# Patient Record
Sex: Female | Born: 1975
Health system: Southern US, Community
[De-identification: ages and names within clinical notes are randomized; demographics above are authoritative.]

## PROBLEM LIST (undated history)

## (undated) HISTORY — PX: ABDOMINAL HYSTERECTOMY: SHX81

## (undated) HISTORY — PX: DIAGNOSTIC LAPAROSCOPY: SUR761

---

## 1998-03-29 ENCOUNTER — Inpatient Hospital Stay (HOSPITAL_COMMUNITY): Admission: AD | Admit: 1998-03-29 | Discharge: 1998-03-29 | Payer: Self-pay | Admitting: Obstetrics and Gynecology

## 1998-07-23 ENCOUNTER — Other Ambulatory Visit: Admission: RE | Admit: 1998-07-23 | Discharge: 1998-07-23 | Payer: Self-pay | Admitting: Obstetrics and Gynecology

## 1999-09-12 ENCOUNTER — Other Ambulatory Visit: Admission: RE | Admit: 1999-09-12 | Discharge: 1999-09-12 | Payer: Self-pay | Admitting: Obstetrics and Gynecology

## 2000-10-15 ENCOUNTER — Other Ambulatory Visit: Admission: RE | Admit: 2000-10-15 | Discharge: 2000-10-15 | Payer: Self-pay | Admitting: Obstetrics and Gynecology

## 2000-11-02 ENCOUNTER — Encounter (INDEPENDENT_AMBULATORY_CARE_PROVIDER_SITE_OTHER): Payer: Self-pay | Admitting: *Deleted

## 2000-11-02 ENCOUNTER — Other Ambulatory Visit: Admission: RE | Admit: 2000-11-02 | Discharge: 2000-11-02 | Payer: Self-pay | Admitting: Obstetrics and Gynecology

## 2000-11-20 ENCOUNTER — Other Ambulatory Visit: Admission: RE | Admit: 2000-11-20 | Discharge: 2000-11-20 | Payer: Self-pay | Admitting: Obstetrics and Gynecology

## 2000-11-20 ENCOUNTER — Encounter (INDEPENDENT_AMBULATORY_CARE_PROVIDER_SITE_OTHER): Payer: Self-pay | Admitting: Specialist

## 2001-03-21 ENCOUNTER — Other Ambulatory Visit: Admission: RE | Admit: 2001-03-21 | Discharge: 2001-03-21 | Payer: Self-pay | Admitting: General Surgery

## 2001-06-24 ENCOUNTER — Other Ambulatory Visit: Admission: RE | Admit: 2001-06-24 | Discharge: 2001-06-24 | Payer: Self-pay | Admitting: Obstetrics and Gynecology

## 2001-07-25 ENCOUNTER — Encounter: Payer: Self-pay | Admitting: Internal Medicine

## 2001-07-25 ENCOUNTER — Ambulatory Visit (HOSPITAL_COMMUNITY): Admission: RE | Admit: 2001-07-25 | Discharge: 2001-07-25 | Payer: Self-pay | Admitting: Internal Medicine

## 2001-10-03 ENCOUNTER — Emergency Department (HOSPITAL_COMMUNITY): Admission: EM | Admit: 2001-10-03 | Discharge: 2001-10-03 | Payer: Self-pay | Admitting: Emergency Medicine

## 2001-11-20 ENCOUNTER — Other Ambulatory Visit: Admission: RE | Admit: 2001-11-20 | Discharge: 2001-11-20 | Payer: Self-pay | Admitting: Obstetrics and Gynecology

## 2002-05-21 ENCOUNTER — Emergency Department (HOSPITAL_COMMUNITY): Admission: EM | Admit: 2002-05-21 | Discharge: 2002-05-21 | Payer: Self-pay | Admitting: Emergency Medicine

## 2002-07-17 ENCOUNTER — Other Ambulatory Visit: Admission: RE | Admit: 2002-07-17 | Discharge: 2002-07-17 | Payer: Self-pay | Admitting: Obstetrics and Gynecology

## 2002-10-31 ENCOUNTER — Ambulatory Visit (HOSPITAL_COMMUNITY): Admission: RE | Admit: 2002-10-31 | Discharge: 2002-10-31 | Payer: Self-pay | Admitting: Obstetrics and Gynecology

## 2002-10-31 ENCOUNTER — Encounter (INDEPENDENT_AMBULATORY_CARE_PROVIDER_SITE_OTHER): Payer: Self-pay

## 2003-05-23 ENCOUNTER — Emergency Department (HOSPITAL_COMMUNITY): Admission: EM | Admit: 2003-05-23 | Discharge: 2003-05-23 | Payer: Self-pay | Admitting: Emergency Medicine

## 2003-06-29 ENCOUNTER — Encounter (HOSPITAL_COMMUNITY): Admission: RE | Admit: 2003-06-29 | Discharge: 2003-07-29 | Payer: Self-pay | Admitting: Family Medicine

## 2003-08-26 ENCOUNTER — Encounter (HOSPITAL_COMMUNITY): Admission: RE | Admit: 2003-08-26 | Discharge: 2003-09-25 | Payer: Self-pay | Admitting: Family Medicine

## 2003-09-29 ENCOUNTER — Encounter: Admission: RE | Admit: 2003-09-29 | Discharge: 2003-10-29 | Payer: Self-pay | Admitting: Family Medicine

## 2003-12-01 ENCOUNTER — Other Ambulatory Visit: Admission: RE | Admit: 2003-12-01 | Discharge: 2003-12-01 | Payer: Self-pay | Admitting: Obstetrics and Gynecology

## 2004-01-22 ENCOUNTER — Ambulatory Visit (HOSPITAL_COMMUNITY): Admission: RE | Admit: 2004-01-22 | Discharge: 2004-01-22 | Payer: Self-pay | Admitting: Obstetrics & Gynecology

## 2004-04-05 ENCOUNTER — Ambulatory Visit (HOSPITAL_COMMUNITY): Admission: RE | Admit: 2004-04-05 | Discharge: 2004-04-05 | Payer: Self-pay | Admitting: Obstetrics and Gynecology

## 2004-05-17 ENCOUNTER — Other Ambulatory Visit: Admission: RE | Admit: 2004-05-17 | Discharge: 2004-05-17 | Payer: Self-pay | Admitting: Obstetrics and Gynecology

## 2004-09-17 ENCOUNTER — Inpatient Hospital Stay (HOSPITAL_COMMUNITY): Admission: AD | Admit: 2004-09-17 | Discharge: 2004-09-17 | Payer: Self-pay | Admitting: Obstetrics and Gynecology

## 2004-09-19 ENCOUNTER — Inpatient Hospital Stay (HOSPITAL_COMMUNITY): Admission: AD | Admit: 2004-09-19 | Discharge: 2004-09-19 | Payer: Self-pay | Admitting: Obstetrics and Gynecology

## 2004-10-04 ENCOUNTER — Ambulatory Visit: Payer: Self-pay | Admitting: *Deleted

## 2004-10-30 ENCOUNTER — Inpatient Hospital Stay (HOSPITAL_COMMUNITY): Admission: AD | Admit: 2004-10-30 | Discharge: 2004-10-30 | Payer: Self-pay | Admitting: Obstetrics and Gynecology

## 2004-11-06 ENCOUNTER — Inpatient Hospital Stay (HOSPITAL_COMMUNITY): Admission: AD | Admit: 2004-11-06 | Discharge: 2004-11-10 | Payer: Self-pay | Admitting: Obstetrics & Gynecology

## 2004-11-07 ENCOUNTER — Encounter (INDEPENDENT_AMBULATORY_CARE_PROVIDER_SITE_OTHER): Payer: Self-pay | Admitting: Specialist

## 2005-01-10 ENCOUNTER — Other Ambulatory Visit: Admission: RE | Admit: 2005-01-10 | Discharge: 2005-01-10 | Payer: Self-pay | Admitting: Obstetrics and Gynecology

## 2005-06-27 ENCOUNTER — Other Ambulatory Visit: Admission: RE | Admit: 2005-06-27 | Discharge: 2005-06-27 | Payer: Self-pay | Admitting: General Surgery

## 2006-02-23 ENCOUNTER — Encounter: Admission: RE | Admit: 2006-02-23 | Discharge: 2006-02-23 | Payer: Self-pay | Admitting: Family Medicine

## 2006-04-18 ENCOUNTER — Ambulatory Visit (HOSPITAL_COMMUNITY): Admission: RE | Admit: 2006-04-18 | Discharge: 2006-04-18 | Payer: Self-pay | Admitting: Obstetrics and Gynecology

## 2006-08-31 ENCOUNTER — Encounter: Admission: RE | Admit: 2006-08-31 | Discharge: 2006-08-31 | Payer: Self-pay | Admitting: Family Medicine

## 2006-12-26 ENCOUNTER — Emergency Department (HOSPITAL_COMMUNITY): Admission: EM | Admit: 2006-12-26 | Discharge: 2006-12-26 | Payer: Self-pay | Admitting: *Deleted

## 2007-04-14 ENCOUNTER — Emergency Department (HOSPITAL_COMMUNITY): Admission: EM | Admit: 2007-04-14 | Discharge: 2007-04-14 | Payer: Self-pay | Admitting: Emergency Medicine

## 2007-05-14 ENCOUNTER — Encounter: Admission: RE | Admit: 2007-05-14 | Discharge: 2007-05-14 | Payer: Self-pay | Admitting: Family Medicine

## 2007-08-27 ENCOUNTER — Emergency Department (HOSPITAL_COMMUNITY): Admission: EM | Admit: 2007-08-27 | Discharge: 2007-08-27 | Payer: Self-pay | Admitting: Emergency Medicine

## 2007-09-30 ENCOUNTER — Other Ambulatory Visit: Admission: RE | Admit: 2007-09-30 | Discharge: 2007-09-30 | Payer: Self-pay | Admitting: Obstetrics and Gynecology

## 2007-11-08 ENCOUNTER — Encounter: Admission: RE | Admit: 2007-11-08 | Discharge: 2007-11-08 | Payer: Self-pay | Admitting: Family Medicine

## 2008-05-29 ENCOUNTER — Encounter: Admission: RE | Admit: 2008-05-29 | Discharge: 2008-05-29 | Payer: Self-pay | Admitting: Family Medicine

## 2009-08-31 ENCOUNTER — Ambulatory Visit (HOSPITAL_COMMUNITY): Admission: RE | Admit: 2009-08-31 | Discharge: 2009-08-31 | Payer: Self-pay | Admitting: Family Medicine

## 2009-10-02 ENCOUNTER — Ambulatory Visit: Payer: Self-pay | Admitting: Unknown Physician Specialty

## 2010-09-04 ENCOUNTER — Encounter: Payer: Self-pay | Admitting: Family Medicine

## 2010-09-04 ENCOUNTER — Encounter: Payer: Self-pay | Admitting: Obstetrics and Gynecology

## 2010-12-30 NOTE — Op Note (Signed)
NAME:  Evelyn Little, Evelyn Little            ACCOUNT NO.:  [id number]   MEDICAL RECORD NO.:  [id number]          PATIENT TYPE:  INP   LOCATION:  9130                          FACILITY:  [facility]   PHYSICIAN:  Freddy Finner, M.D.   DATE OF BIRTH:  03-Jun-1976   DATE OF PROCEDURE:  11/07/2004  DATE OF DISCHARGE:                                 OPERATIVE REPORT   PREOPERATIVE DIAGNOSIS:  Multiparity, request for surgical sterilization.   POSTOPERATIVE DIAGNOSIS:  Multiparity, request for surgical sterilization.   OPERATIVE PROCEDURE:  Bilateral tubal ligation, Pomeroy technique.   SURGEON:  Freddy Finner, M.D.   ANESTHESIA:  Epidural.   ESTIMATED INTRAOPERATIVE BLOOD LOSS:  Less than 10 cc.   INTRAOPERATIVE COMPLICATIONS:  None.   The patient is a 35 year old who just gave birth to her second term infant.  She has requested surgical sterilization.  She has epidural in place which  is dosed for surgery.  She was brought to the operating room and there  placed in the dorsal recumbent position.  Abdomen was prepped in the usual  fashion with Betadine solution.  Foley catheter was placed using sterile  technique.  Sterile drapes were applied.  An incision was made through an  old subumbilical elliptical incision.  It was carried sharply down to  fascia, which was entered sharply and extended to the extent of the skin  incision.  Peritoneum was entered sharply and extended bluntly.  The right  fallopian tube was identified, grasped with a Babcock clamp, traced to its  distal fimbriated end.  Midportion of the tube was then doubly ligated with  0 plain ties x 2.  A segment of tube was excised, and the distal mucosal  ends of the tube on each side fulgurated with a Bovie.  Left side was  treated identically.  All instruments were removed.  Hemostasis was  complete.  Counts were correct.  Abdominal incision was closed in layers.  Running 0 Dexon was used to close the fascia, and subcuticular  interrupted 3-  0 Dexon sutures were used to close the skin.  The patient tolerated the  operative procedure well and was taken to recovery in good condition.      WRN/MEDQ  D:  11/07/2004  T:  11/07/2004  Job:  [id number]

## 2010-12-30 NOTE — H&P (Signed)
   NAME:  Evelyn Little, Evelyn Little                        ACCOUNT NO.:  [id number]   MEDICAL RECORD NO.:  [id number]                   PATIENT TYPE:  AMB   LOCATION:  SDC                                  FACILITY:  [facility]   PHYSICIAN:  Duke Salvia. Marcelle Overlie, M.D.            DATE OF BIRTH:  January 31, 1976   DATE OF ADMISSION:  10/31/2002  DATE OF DISCHARGE:                                HISTORY & PHYSICAL   CHIEF COMPLAINT:  Missed AB.   HISTORY OF PRESENT ILLNESS:  A 35 year old G5, P0-1-3-1.  She has had two  prior SABs and one prior ectopic, one delivery at 36 weeks in 1993.  Early  in this pregnancy she had some spotting but her QhCG levels increased  appropriately, although her progesterone early was 10.2 and supplemental  progesterone was started in the form of Prometrium HS.  Ultrasound dated  February 23 showed a single IUP, positive FHR.  She presented for a new OB  appointment earlier this week and was noted to have fetal pole that was  visualized but no FHR was noted.  She presents now for D&E.  We have planned  to send a portion of tissue for genetic studies also.  This procedure  including risks of bleeding, infection, other complications such as  perforation that may require additional surgery reviewed with her which she  understands and accepts.   Blood type is B+.   ALLERGIES:  AMPICILLIN.   PAST OBSTETRICAL HISTORY:  Two SABs, one ectopic treated with methotrexate,  one vaginal delivery at 36 weeks in 1993.   REVIEW OF SYSTEMS:  She has had a history of abnormal Pap in the past and  UTI.   PHYSICAL EXAMINATION:  VITAL SIGNS:  Temperature 98.2, blood pressure  120/72.  HEENT:  Unremarkable.  NECK:  Supple without mass.  LUNGS:  Clear.  CARDIOVASCULAR:  Regular rate and rhythm without murmur, rub, or gallop  noted.  BREASTS:  Without masses.  ABDOMEN:  Soft, flat, and nontender.  PELVIC:  Normal external genitalia.  Vagina, cervix clear.  Uterus is 8  weeks size, mid  position.  Adnexa negative.  EXTREMITIES:  Unremarkable.  NEUROLOGIC:  Unremarkable.   IMPRESSION:  Missed abortion.   PLAN:  D&E.  Procedure and risks reviewed as above.                                               Richard M. Marcelle Overlie, M.D.    RMH/MEDQ  D:  10/31/2002  T:  10/31/2002  Job:  [id number]

## 2010-12-30 NOTE — Op Note (Signed)
NAME:  Evelyn Little, Evelyn Little                      ACCOUNT NO.:  -   MEDICAL RECORD NO.:  [id number]                   PATIENT TYPE:  AMB   LOCATION:  SDC                                  [facility]ACILITY:  [facility]   PHYSICIAN:  Freddy Finner, Little.D.                DATE OF BIRTH:  1975/11/10   DATE OF PROCEDURE:  DATE OF DISCHARGE:                                 OPERATIVE REPORT   PREOPERATIVE DIAGNOSIS:  Left tubal pregnancy.   POSTOPERATIVE DIAGNOSES:  1. Negative laparoscopy for ectopic pregnancy.  No evidence of ectopic     pregnancy anywhere in the pelvis including both fallopian tubes.  2. Dense adhesion to left ovary and minimal evidence of pelvic endometriosis     in the cul-de-sac.   PROCEDURE:  1. Laparoscopy.  2. Lysis of parovarian adhesion and fulguration of pelvic endometriosis.   SURGEON:  Freddy Finner, Little.D.   ANESTHESIA:  General.   ESTIMATED INTRAOPERATIVE BLOOD LOSS:  Less than 10 cc.   INTRAOPERATIVE COMPLICATIONS:  None.   The patient is a 35 year old gravida 4, para 1 who, by history had an  ectopic pregnancy in the left fallopian tube in 1998, successfully treated  with methotrexate. Since May 16 of this year she has been followed in the  office during attempts to conceive on Clomid and had an ultrasound in the  office on May 16 that showed a moderate amount of free fluid, but no  evidence of ectopic or intrauterine gestational size.  A complex left  ovarian cyst was noted at that time.  She was given ectopic warnings at that  time. She was then followed with serial ultrasounds. On May 16 the  quantitative hCG was 616; on the May 18 was 308; and on May 23 was 136; and  on May 25 was 131.  She was followed up, again, on the day prior to the  surgery at which time the quantitative hCG was 391.  Lab recheck confirmed  the accuracy of this report and she was brought to the office, today, for  pelvic ultrasound. The ultrasound showed a 2.8 x 2.2 cm hypoechoic mass  with  2 ________ scenarios within the mass and it was in the left adnexa.  There  was said to be free fluid surrounding this.  Exam was somewhat compromised  by patient guarding, but there was said to be a moderate amount of free  fluid in the cul-de-sac with findings strongly suggestive of ectopic  pregnancy.   After reviewing her past history and that history including an ectopic  pregnancy on the left, in the past; options of therapy were discussed with  the patient including salpingectomy because of the markedly increased risk  of ectopic pregnancy on that side with 2 on each side.  Based on this we  have elected to proceed with laparoscopy with the plan to resect the left  tube if the ectopic  pregnancy was present there.  She is admitted, at this  time, for that purpose.   INTRAOPERATIVE FINDINGS:  Did not confirm the ectopic pregnancy.  There were  endometriotic like lesions along the uterosacrals and the cul-de-sac on both  sides.  There was a dense band-like adhesion from the ovary to the  peritoneum of the broad ligament on the left side.  There were follicular  appearing cysts in the left ovary and a follicular-type cyst of the right  ovary.  Both fallopian tubes were completely normal throughout.  An attempt  was made to milk the left fallopian tube with the bipolar Kleppinger forceps  with no production of blood clot, or other material.  There was a paratubal  cyst on the right hand side measuring approximately 1 cm.  The uterus,  itself, appeared to be normal.  The appendix was visualized.  There were  some periappendiceal adhesions, but no acute evidence of infection.  There  was no apparent abnormality in the upper abdomen.   DESCRIPTION OF PROCEDURE:  The patient was admitted on the afternoon of  surgery, brought to the operating room after receiving 1 gm of Rocephin IV.  She was placed under adequate general anesthesia and placed in the dorsal  lithotomy position using  the [employer] system.  Betadine prep of the  abdomen, perineum, and vaginal was carried out in the usual fashion.  The  bladder was evacuated using a sterile Robinson catheter.  Cervix was  visualized and a Hulka tenaculum attached without difficulty.  Sterile  drapes were applied.  Two small incisions were made, one at the umbilicus  and one just above the symphysis.   A 10-11, disposable, non-bladed trocar was introduced at the umbilicus while  elevating the anterior abdominal wall manually.  Direct inspection with the  laparoscope revealed adequate placement with no evidence of injury on entry.  Pneumoperitoneum was allowed to accumulate with carbon dioxide gas.  A 5 mm  trocar was placed in the lower, small incision, just above the symphysis.  A  blunt probe in light of the __________ air gushing system was used through  the lower trocar sleeve.  Careful systematic examination of the pelvic and  abdominal contents was carried out with findings as noted above.  The  bipolar Kleppinger forceps were introduced and the left fallopian tube  milked producing no additional material whatsoever nor blood and no products  of conception.  There was no blood in the abdomen initially or on conclusion  of the case.  There was a very small amount of clear, yellow fluid in the  cul-de-sac.  Irrigation was carried out using the __________ system and  lactated Ringers solution.  Irrigating solution was aspirated from the  abdomen.  Gas was allowed to escape from the abdomen.   All instruments were removed.  Hemostasis was adequate.  Plain Marcaine 1/4%  was injected into the incision sites for postoperative analgesia.  The  patient was given Darvocet for postoperative pain management.  She was  awakened, taken to the recovery room in good condition.  She will be  discharged in the immediate postoperative period for follow up in the office next week.  She is to call for severe pain, for any  significant amount of  vaginal or abdominal bleeding. She is to have aggressively increasing  activity over the next 48 to 72 hours. Normal activity, thereafter, as  tolerated.  Freddy Finner, Little.D.    WRN/MEDQ  D:  01/22/2004  T:  01/22/2004  Job:  [id number]

## 2010-12-30 NOTE — Op Note (Signed)
   NAME:  Evelyn Little, MIEDEMA                        ACCOUNT NO.:  [id number]   MEDICAL RECORD NO.:  [id number]                   PATIENT TYPE:  AMB   LOCATION:  SDC                                  FACILITY:  [facility]   PHYSICIAN:  Duke Salvia. Marcelle Overlie, M.D.            DATE OF BIRTH:  1975/10/13   DATE OF PROCEDURE:  10/31/2002  DATE OF DISCHARGE:                                 OPERATIVE REPORT   PREOPERATIVE DIAGNOSES:  1. Recurrent pregnancy, lost.  2. Missed abortion.   POSTOPERATIVE DIAGNOSES:  1. Recurrent pregnancy, lost.  2. Missed abortion.   PROCEDURE:  Dilatation and evacuation.   SURGEON:  Duke Salvia. Marcelle Overlie, M.D.   ANESTHESIA:  Paracervical block plus general.   PROCEDURE IN DETAIL:  The patient taken to the operating room.  After an  adequate level of sedation was obtained and the patient's legs in stirrups,  the perineum and vagina prepped and draped in the usual manner for D&E.  The  bladder was drained.  EUA carried out.  The uterus was eight weeks' size,  normal position, adnexa negative.  A paracervical block was then created by  infiltrating it at 3 and 9 o'clock submucosally.  After negative aspiration  and a very tight internal os and due to discomfort, the decision was made to  proceed to continue with general anesthesia.  Once she was comfortable,  the  uterus was sounded to 8 cm, progressively dilated to a 29 Pratt.  The cervix  was still very tight and this had to be done very slowly and carefully.  A  #7 curved suction curette was then used to curette a moderate amount of  tissue but no further tissue could be removed.  A small blunt curette was  used to explore the cavity revealing the walls to be clean.  There was no  bleeding.  A portion of the tissue was sent for genetic studies also.  She  tolerated this well and went to the recovery room in good condition.                                               Richard M. Marcelle Overlie, M.D.    RMH/MEDQ  D:   10/31/2002  T:  11/01/2002  Job:  [id number]

## 2011-04-17 ENCOUNTER — Emergency Department (HOSPITAL_COMMUNITY): Payer: Medicaid Other

## 2011-04-17 ENCOUNTER — Emergency Department (HOSPITAL_COMMUNITY)
Admission: EM | Admit: 2011-04-17 | Discharge: 2011-04-18 | Disposition: A | Discharge status: Home / Self Care | Payer: Medicaid Other | Attending: Emergency Medicine | Admitting: Emergency Medicine

## 2011-04-17 DIAGNOSIS — R110 Nausea: Secondary | ICD-10-CM | POA: Insufficient documentation

## 2011-04-17 DIAGNOSIS — R51 Headache: Secondary | ICD-10-CM | POA: Insufficient documentation

## 2011-04-17 MED ORDER — SODIUM CHLORIDE 0.45 % IV BOLUS: 500.0000 mL | Freq: Once | INTRAVENOUS | Status: DC

## 2011-04-17 MED ORDER — KETOROLAC TROMETHAMINE 30 MG/ML IJ SOLN
30.0000 mg | Freq: Once | INTRAMUSCULAR | Status: AC
  Administered 2011-04-17: 30 mg via INTRAVENOUS
  Filled 2011-04-17: qty 1

## 2011-04-17 MED ORDER — SODIUM CHLORIDE 0.9 % IV SOLN
Freq: Once | INTRAVENOUS | Status: AC
  Administered 2011-04-17: 22:00:00 via INTRAVENOUS

## 2011-04-17 MED ORDER — METOCLOPRAMIDE HCL 5 MG/ML IJ SOLN
10.0000 mg | Freq: Once | INTRAMUSCULAR | Status: AC
  Administered 2011-04-17: 10 mg via INTRAVENOUS
  Filled 2011-04-17: qty 2

## 2011-04-17 MED ORDER — DIPHENHYDRAMINE HCL 25 MG PO CAPS
25.0000 mg | ORAL_CAPSULE | Freq: Once | ORAL | Status: AC
  Administered 2011-04-17: 25 mg via ORAL
  Filled 2011-04-17: qty 1

## 2011-04-17 NOTE — ED Notes (Signed)
Headache x 2 1/2 weeks, took last hydrocodone last week,, but did not ease headache at all, has not been seen by pmd

## 2011-04-18 MED ORDER — TRAMADOL HCL 50 MG PO TABS: ORAL_TABLET | ORAL | Status: DC

## 2011-04-18 NOTE — ED Provider Notes (Signed)
History     CSN: [id number] Arrival date & time: 04/17/2011  8:19 PM  Chief Complaint  Patient presents with  . Headache    x 2 1/2 weeks   Patient is a 35 y.o. female presenting with headaches. The history is provided by the patient.  Headache  This is a recurrent problem. The current episode started more than 1 week ago. The problem occurs constantly. The problem has not changed since onset.The headache is associated with bright light and activity. The pain is located in the right unilateral, parietal and temporal region. The quality of the pain is described as throbbing. The pain is moderate. The pain does not radiate. Associated symptoms include nausea. Pertinent negatives include no fever, no malaise/fatigue, no chest pressure, no orthopnea, no palpitations, no syncope, no shortness of breath and no vomiting. She has tried oral narcotic analgesics for the symptoms. The treatment provided no relief.    Past Medical History  Diagnosis Date  . Neurofibromatosis     History reviewed. No pertinent past surgical history.  No family history on file.  History  Substance Use Topics  . Smoking status: Never Smoker   . Smokeless tobacco: Not on file  . Alcohol Use: Yes     occ    OB History    Grav Para Term Preterm Abortions TAB SAB Ect Mult Living                  Review of Systems  Constitutional: Negative for fever, chills, malaise/fatigue, activity change and appetite change.  HENT: Negative for facial swelling, trouble swallowing, neck pain, neck stiffness and dental problem.   Respiratory: Negative for shortness of breath.   Cardiovascular: Negative for palpitations, orthopnea and syncope.  Gastrointestinal: Positive for nausea. Negative for vomiting.  Musculoskeletal: Negative for back pain and arthralgias.  Skin: Negative.   Neurological: Positive for headaches. Negative for dizziness, speech difficulty, weakness and numbness.  Hematological: Negative for adenopathy.  Does not bruise/bleed easily.  All other systems reviewed and are negative.    Physical Exam  BP 100/64  Pulse 73  Temp(Src) 98.8 F (37.1 C) (Oral)  Resp 16  Ht 5\' 5"  (1.651 m)  Wt 133 lb (60.328 kg)  BMI 22.13 kg/m2  SpO2 100%  LMP 04/03/2011  Physical Exam  Nursing note and vitals reviewed. Constitutional: She is oriented to person, place, and time. She appears well-developed and well-nourished. No distress.  HENT:  Head: Normocephalic and atraumatic.  Right Ear: External ear normal.  Left Ear: External ear normal.  Mouth/Throat: Oropharynx is clear and moist.  Eyes: Conjunctivae and EOM are normal. Pupils are equal, round, and reactive to light.  Neck: Normal range of motion and phonation normal. Neck supple. No spinous process tenderness and no muscular tenderness present. No rigidity. No Brudzinski's sign and no Kernig's sign noted. No thyromegaly present.  Cardiovascular: Normal rate and regular rhythm.   Pulmonary/Chest: Effort normal and breath sounds normal.  Musculoskeletal: She exhibits no edema and no tenderness.  Lymphadenopathy:    She has no cervical adenopathy.  Neurological: She is alert and oriented to person, place, and time. She displays normal reflexes. No cranial nerve deficit. She exhibits normal muscle tone. Coordination normal.  Skin: Skin is warm and dry.  Psychiatric: She has a normal mood and affect.    ED Course  Procedures  MDM   MEDICATIONS GIVEN IN THE ED:   Medications  ibuprofen (ADVIL,MOTRIN) 800 MG tablet (not administered)  sodium chloride 0.45 %  bolus 500 mL (not administered)  0.9 %  sodium chloride infusion (  Intravenous New Bag 04/17/11 2140)  ketorolac (TORADOL) injection 30 mg (30 mg Intravenous Given 04/17/11 2139)  metoCLOPramide (REGLAN) injection 10 mg (10 mg Intravenous Given 04/17/11 2140)  diphenhydrAMINE (BENADRYL) capsule 25 mg (25 mg Oral Given 04/17/11 2140)       Ct Head Wo Contrast  04/18/2011  *RADIOLOGY REPORT*   Clinical Data: Severe headache  CT HEAD WITHOUT CONTRAST  Technique:  Contiguous axial images were obtained from the base of the skull through the vertex without contrast.  Comparison: None.  Findings: Ventricles and sulci appear symmetrical without mass effect or midline shift.  No abnormal extra-axial fluid collections.  Gray-white matter junctions are distinct.  Basal cisterns are not effaced.  No evidence of acute intracranial hemorrhage.  Visualized paranasal sinuses are not opacified.  No depressed skull fractures.  IMPRESSION: No evidence of acute intracranial hemorrhage, mass lesion, or acute infarct.  Original Report Authenticated By: Marlon Pel, M.D.      12:19 AM    Pt feels improved after observation and/or treatment in ED.     The patient appears reasonably screened and/or stabilized for discharge and I doubt any other medical condition or other [facility] requiring further screening, evaluation, or treatment in the ED at this time prior to discharge. She agrees to f/u with her PMD for recheck.   Lore Polka L. Berenise Hunton, [place] 04/21/11 1535

## 2011-04-18 NOTE — ED Notes (Signed)
Pt self ambulated out of the er staing noneeds

## 2011-04-19 NOTE — ED Notes (Signed)
Pt called today and stated that  Her headache has returned and was wanting to know what neurologist she had been referred to . Pulled pt's discharge instructions and referral was to Dr. Phillips Odor who is pt pcp. Advised pt to follow up with Dr. Phillips Odor or return to er if needed.

## 2011-04-25 NOTE — ED Provider Notes (Signed)
Medical screening examination/treatment/procedure(s) were performed by non-physician practitioner and as supervising physician I was immediately available for consultation/collaboration. Devoria Albe, MD, Armando Gang    Ward Givens, MD 04/25/11 [phone]

## 2013-09-12 DIAGNOSIS — B351 Tinea unguium: Secondary | ICD-10-CM

## 2013-09-23 ENCOUNTER — Encounter: Payer: Self-pay | Admitting: Podiatry

## 2013-09-23 ENCOUNTER — Ambulatory Visit (INDEPENDENT_AMBULATORY_CARE_PROVIDER_SITE_OTHER): Payer: 59 | Admitting: Podiatry

## 2013-09-23 ENCOUNTER — Ambulatory Visit (INDEPENDENT_AMBULATORY_CARE_PROVIDER_SITE_OTHER): Payer: 59

## 2013-09-23 VITALS — BP 115/77 | HR 92 | Resp 16 | Ht 64.0 in | Wt 134.0 lb

## 2013-09-23 DIAGNOSIS — M79609 Pain in unspecified limb: Secondary | ICD-10-CM

## 2013-09-23 DIAGNOSIS — M79671 Pain in right foot: Secondary | ICD-10-CM

## 2013-09-23 DIAGNOSIS — S93402A Sprain of unspecified ligament of left ankle, initial encounter: Secondary | ICD-10-CM

## 2013-09-23 DIAGNOSIS — S93409A Sprain of unspecified ligament of unspecified ankle, initial encounter: Secondary | ICD-10-CM

## 2013-09-23 NOTE — Progress Notes (Signed)
   Subjective:    Patient ID: Evelyn Little, female    DOB: 06/01/1976, 38 y.o.   MRN: [id number]  HPI Comments: Twisted ankle on Saturday and heard a pop on top of the foot and it has been hurting since   Foot Pain      Review of Systems  All other systems reviewed and are negative.       Objective:   Physical Exam: I have reviewed her past history medications allergies surgeries and social history. Pulses are strongly palpable right lower extremity. She has tenderness overlying the anterior talofibular ligament of the right foot. She also has tenderness on palpation of the sinus tarsi of the right foot. However she has no pain on end range of motion. Radiographic evaluation demonstrates no osseous abnormalities. Cutaneous evaluating Mr. is supple while hydrated cutis no erythema edema cellulitis drainage or odor and no ecchymosis.        Assessment & Plan:  Assessment: Sprain ankle right foot with early subtalar joint capsulitis right.  Plan: Discussed the etiology pathology conservative versus surgical therapies and she will followup with me as needed.

## 2013-10-14 ENCOUNTER — Ambulatory Visit (INDEPENDENT_AMBULATORY_CARE_PROVIDER_SITE_OTHER): Payer: 59 | Admitting: Podiatry

## 2013-10-14 ENCOUNTER — Encounter: Payer: Self-pay | Admitting: Podiatry

## 2013-10-14 VITALS — BP 115/77 | HR 92 | Resp 18

## 2013-10-14 DIAGNOSIS — M7750 Other enthesopathy of unspecified foot: Secondary | ICD-10-CM

## 2013-10-14 DIAGNOSIS — M779 Enthesopathy, unspecified: Principal | ICD-10-CM

## 2013-10-14 DIAGNOSIS — M778 Other enthesopathies, not elsewhere classified: Secondary | ICD-10-CM

## 2013-10-14 DIAGNOSIS — M79609 Pain in unspecified limb: Secondary | ICD-10-CM

## 2013-10-14 NOTE — Progress Notes (Signed)
Right foot injection.  Objective: Vital signs are stable she is alert and oriented x3. She has pain on palpation and on end range of motion of the subtalar joint of the right foot. She also has pain on direct palpation of the fourth fifth metatarsocuboid articulation site.  Assessment: Capsulitis to the after mentioned areas.  Plan: Injected Kenalog and local anesthetic subtalar joint of the right foot after sterile Betadine skin prep. Injected dexamethasone to the point of maximal tenderness right foot. I will followup with her in 3-4 weeks.

## 2013-10-19 ENCOUNTER — Emergency Department (HOSPITAL_COMMUNITY): Payer: 59

## 2013-10-19 ENCOUNTER — Encounter (HOSPITAL_COMMUNITY): Payer: Self-pay | Admitting: Emergency Medicine

## 2013-10-19 ENCOUNTER — Emergency Department (HOSPITAL_COMMUNITY)
Admission: EM | Admit: 2013-10-19 | Discharge: 2013-10-19 | Disposition: A | Discharge status: Home / Self Care | Payer: 59 | Attending: Emergency Medicine | Admitting: Emergency Medicine

## 2013-10-19 DIAGNOSIS — R112 Nausea with vomiting, unspecified: Secondary | ICD-10-CM | POA: Insufficient documentation

## 2013-10-19 DIAGNOSIS — R51 Headache: Secondary | ICD-10-CM | POA: Insufficient documentation

## 2013-10-19 DIAGNOSIS — Z8679 Personal history of other diseases of the circulatory system: Secondary | ICD-10-CM | POA: Insufficient documentation

## 2013-10-19 DIAGNOSIS — R519 Headache, unspecified: Secondary | ICD-10-CM

## 2013-10-19 DIAGNOSIS — R42 Dizziness and giddiness: Secondary | ICD-10-CM | POA: Insufficient documentation

## 2013-10-19 DIAGNOSIS — R5383 Other fatigue: Secondary | ICD-10-CM

## 2013-10-19 DIAGNOSIS — R000 Tachycardia, unspecified: Secondary | ICD-10-CM | POA: Insufficient documentation

## 2013-10-19 DIAGNOSIS — R5381 Other malaise: Secondary | ICD-10-CM | POA: Insufficient documentation

## 2013-10-19 DIAGNOSIS — Z880 Allergy status to penicillin: Secondary | ICD-10-CM | POA: Insufficient documentation

## 2013-10-19 MED ORDER — METOCLOPRAMIDE HCL 5 MG/ML IJ SOLN
10.0000 mg | Freq: Once | INTRAMUSCULAR | Status: AC
  Administered 2013-10-19: 10 mg via INTRAVENOUS
  Filled 2013-10-19: qty 2

## 2013-10-19 MED ORDER — SODIUM CHLORIDE 0.9 % IV SOLN: INTRAVENOUS | Status: DC

## 2013-10-19 MED ORDER — SODIUM CHLORIDE 0.9 % IV BOLUS (SEPSIS)
1000.0000 mL | Freq: Once | INTRAVENOUS | Status: AC
  Administered 2013-10-19: 1000 mL via INTRAVENOUS

## 2013-10-19 MED ORDER — DIPHENHYDRAMINE HCL 50 MG/ML IJ SOLN
25.0000 mg | Freq: Once | INTRAMUSCULAR | Status: AC
  Administered 2013-10-19: 16:00:00 via INTRAVENOUS
  Filled 2013-10-19: qty 1

## 2013-10-19 NOTE — ED Notes (Signed)
Patient given discharge instruction, verbalized understand. IV removed, band aid applied. Patient ambulatory out of the department.  

## 2013-10-19 NOTE — ED Notes (Addendum)
Headache with feeling of weakness, vomiting, diaphoretic,

## 2013-10-19 NOTE — ED Notes (Signed)
Per patient had dull headache all day that suddenly became intense. Patient states she then vomited, felt diaphoretic and generalized weakness all over. Patient reports body "giving out from under her and falling."Denies LOC. Patient still reports headache with generalized weakness and dizziness.

## 2013-10-19 NOTE — Discharge Instructions (Signed)

## 2013-10-19 NOTE — ED Provider Notes (Signed)
CSN: [id number]     Arrival date & time 10/19/13  1423 History   First MD Initiated Contact with Patient 10/19/13 1506     Chief Complaint  Patient presents with  . Headache  . Fatigue  . Dizziness     (Consider location/radiation/quality/duration/timing/severity/associated sxs/prior Treatment) Patient is a 38 y.o. female presenting with headaches and dizziness. The history is provided by the patient.  Headache Associated symptoms: dizziness   Dizziness Associated symptoms: headaches    patient here complaining of sudden onset of bitemporal headache with associated nausea and nonbilious emesis. Does have a history of migraines and this is similar. She used to take Topamax but has been off of it for quite some time. Denies any photophobia but no photophobia. No syncope or near-syncope. No treatment used prior to arrival. Denies any neck pain or fever. Did have whole-body weakness due to the severity of the headache. Denies any visual changes. Has a history of neurofibromatosis has ever had a CT scan. No reported seizure activity. It is characterized as dull.  Past Medical History  Diagnosis Date  . Neurofibromatosis    History reviewed. No pertinent past surgical history. Family History  Problem Relation Age of Onset  . Hypertension Mother    History  Substance Use Topics  . Smoking status: Never Smoker   . Smokeless tobacco: Never Used  . Alcohol Use: Yes     Comment: occ   OB History   Grav Para Term Preterm Abortions TAB SAB Ect Mult Living   2 2  2      2      Review of Systems  Neurological: Positive for dizziness and headaches.  All other systems reviewed and are negative.      Allergies  Ampicillin; Penicillins; Bc powder; and Pseudoephedrine  Home Medications   Current Outpatient Rx  Name  Route  Sig  Dispense  Refill  . ALPRAZolam (XANAX) 1 MG tablet   Oral   Take 1 mg by mouth at bedtime as needed for anxiety.         Marland Kitchen ibuprofen (ADVIL,MOTRIN) 800  MG tablet   Oral   Take 800 mg by mouth every 8 (eight) hours as needed.            BP 143/81  Pulse 107  Temp(Src) 98.6 F (37 C) (Oral)  Resp 18  Ht 5\' 5"  (1.651 m)  Wt 136 lb (61.689 kg)  BMI 22.63 kg/m2  SpO2 97%  LMP 10/13/2013 Physical Exam  Nursing note and vitals reviewed. Constitutional: She is oriented to person, place, and time. She appears well-developed and well-nourished.  Non-toxic appearance. No distress.  HENT:  Head: Normocephalic and atraumatic.  Eyes: Conjunctivae, EOM and lids are normal. Pupils are equal, round, and reactive to light.  Neck: Normal range of motion. Neck supple. No tracheal deviation present. No mass present.  Cardiovascular: Regular rhythm and normal heart sounds.  Tachycardia present.  Exam reveals no gallop.   No murmur heard. Pulmonary/Chest: Effort normal and breath sounds normal. No stridor. No respiratory distress. She has no decreased breath sounds. She has no wheezes. She has no rhonchi. She has no rales.  Abdominal: Soft. Normal appearance and bowel sounds are normal. She exhibits no distension. There is no tenderness. There is no rebound and no CVA tenderness.  Musculoskeletal: Normal range of motion. She exhibits no edema and no tenderness.  Neurological: She is alert and oriented to person, place, and time. She has normal strength. No cranial  nerve deficit or sensory deficit. GCS eye subscore is 4. GCS verbal subscore is 5. GCS motor subscore is 6.  Skin: Skin is warm and dry. No abrasion and no rash noted.  Psychiatric: She has a normal mood and affect. Her speech is normal and behavior is normal.    ED Course  Procedures (including critical care time) Labs Review Labs Reviewed - No data to display Imaging Review No results found.   EKG Interpretation None      MDM   Final diagnoses:  None   Patient given IV fluids and medications for migraine and feels better. Repeat neurological exam at time of discharge is  stable. She will followup with a neurologist for treatment of her chronic migraines. Do not think the patient has subarachnoid hemorrhage.     Toy BakerAnthony T Naeema Patlan, MD 10/19/13 [phone]

## 2013-11-21 DIAGNOSIS — M779 Enthesopathy, unspecified: Secondary | ICD-10-CM

## 2013-12-23 DIAGNOSIS — M722 Plantar fascial fibromatosis: Secondary | ICD-10-CM

## 2013-12-25 NOTE — H&P (Signed)
Cherly BeachDana M Arceneaux  DICTATION # [id number] CSN# [id number]   Meriel Picaichard M Ethleen Lormand, MD 12/25/2013 2:05 PM

## 2013-12-26 NOTE — H&P (Signed)
NAME:  Evelyn Little, Evelyn Little           ACCOUNT NO.:  [id number]  MEDICAL RECORD NO.:  [id number]  LOCATION:                                 FACILITY:  PHYSICIAN:  Duke Salviaichard M. Marcelle Little, Evelyn LittleDATE OF BIRTH:  1975/09/21  DATE OF ADMISSION: DATE OF DISCHARGE:                             HISTORY & PHYSICAL   CHIEF COMPLAINT:  Chronic pelvic pain, history of endometriosis.  HISTORY OF PRESENT ILLNESS:  A 38 year old, G7, P2, prior tubal ligation, that I have been seeing since she was age 38.  She had laparoscopy in 2005, that showed left adnexal adhesions and early stage endometriosis.  In 1998, was treated with methotrexate for ectopic pregnancy.  She also has a history of RPL and LEEP in the past. Recently presented with worsening problems related to pelvic pain in midline and to the right.  FSH was checked that was 4.8.  Ultrasound done in our office November 26, 2013, showed a retroflexed uterus, possible adenomyosis based on thickened myometrium, perhaps a septated uterus with a simple cyst on each side, very small 1.5 cm.  No free fluid and her uterine cavity looked normal except for the septum. She has been taking Percocet for pain, and presents at this time for definitive hysterectomy, possible RSO, possible TAH-BSO depending on the findings. We discussed other more conservative treatment options such as a trial of Lupron, continuous OCPs or diagnostic laparoscopy before proceeding. Due to the chronicity of her pain, she would prefer to proceed with definitive treatment.  This procedure including specific risks related to bleeding, infection, transfusion, adjacent organ injury, possible need to complete the surgery by an open technique reviewed.  She would prefer to conserve her left ovary if normal.  PAST MEDICAL HISTORY:  Allergies:  PENICILLIN, SUDAFED, TOPAMAX.  CURRENT MEDICATIONS:  Percocet p.r.n., Xanax at bedtime, and Motrin p.r.n.  SURGICAL HISTORY:  She has had a tubal,  prior laparoscopy, 2 vaginal deliveries.  REVIEW OF SYSTEMS:  Significant for history of ectopic pregnancy, migraine headache, abnormal Paps in the past, UTI and skin disease.  FAMILY HISTORY:  Also significant for migraine headache, UTI, and skin disease.  SOCIAL HISTORY:  Denies alcohol, tobacco, or drug use.  She is single. Dr. Catalina PizzaZach Hall is her medical doctor.  Last Pap April 15, was normal.  PHYSICAL EXAMINATION:  VITAL SIGNS:  Temp 98.2, blood pressure 120/78. HEENT:  Unremarkable. NECK:  Supple without masses. LUNGS:  Clear. CARDIOVASCULAR:  Regular rate and rhythm without murmurs, rubs, or gallops. BREASTS:  Without masses. ABDOMEN:  Soft, flat, and nontender. PELVIC:  Vulva, vagina and cervix normal.  Uterus, mid to posterior normal size, mildly tender.  No unusual nodularity.  Adnexa without masses, although she does have some mild tenderness on the right side. EXTREMITIES:  Unremarkable. NEUROLOGIC:  Unremarkable.  IMPRESSION:  Chronic pelvic pain, history of endometriosis, possible adenomyosis.  PLAN:  LAVH, RSO, possible TAH, RSO depending on the operative findings, procedure and risks discussed as above.     Evelyn Little, M.D.     RMH/MEDQ  D:  12/25/2013  T:  12/26/2013  Job:  [id number]

## 2013-12-30 ENCOUNTER — Encounter (HOSPITAL_COMMUNITY): Payer: Self-pay | Admitting: Pharmacist

## 2013-12-31 NOTE — Patient Instructions (Signed)
   Your procedure is scheduled BJ:YNWGNFAOZon:WEDNESDAY MAY 27TH AT 730AM  [facility] Pick up the phone at the desk and dial [phone] and inform us of your arrival.  Please call this number if you have any problems the morning of surgery: [phone]  Remember: Do not eat food after midnight: Do not drink clear liquids after: Take these medicines the morning of surgery with a SIP OF WATER:  Do not wear jewelry, make-up, or FINGER nail polish No metal in your hair or on your body. Do not wear lotions, powders, perfumes.  You may wear deodorant.  Do not bring valuables to the hospital. Contacts, dentures or bridgework may not be worn into surgery.  Leave suitcase in the car. After Surgery it may be brought to your room. For patients being admitted to the hospital, checkout time is 11:00am the day of discharge.    Patients discharged on the day of surgery will not be allowed to drive home.

## 2014-01-01 ENCOUNTER — Encounter (HOSPITAL_COMMUNITY): Payer: Self-pay

## 2014-01-01 ENCOUNTER — Encounter (HOSPITAL_COMMUNITY)
Admission: RE | Admit: 2014-01-01 | Discharge: 2014-01-01 | Disposition: A | Discharge status: Home / Self Care | Payer: 59 | Source: Ambulatory Visit | Attending: Obstetrics and Gynecology | Admitting: Obstetrics and Gynecology

## 2014-01-01 DIAGNOSIS — Z01812 Encounter for preprocedural laboratory examination: Secondary | ICD-10-CM | POA: Insufficient documentation

## 2014-01-01 LAB — CBC
HCT: 43.1 % (ref 36.0–46.0)
Hemoglobin: 15.3 g/dL — ABNORMAL HIGH (ref 12.0–15.0)
MCH: 33.3 pg (ref 26.0–34.0)
MCHC: 35.5 g/dL (ref 30.0–36.0)
MCV: 93.7 fL (ref 78.0–100.0)
PLATELETS: 200 10*3/uL (ref 150–400)
RBC: 4.6 MIL/uL (ref 3.87–5.11)
RDW: 12.5 % (ref 11.5–15.5)
WBC: 8.7 10*3/uL (ref 4.0–10.5)

## 2014-01-06 MED ORDER — GENTAMICIN SULFATE 40 MG/ML IJ SOLN
INTRAVENOUS | Status: AC
  Administered 2014-01-07: 113.75 mL via INTRAVENOUS
  Filled 2014-01-06: qty 7.75

## 2014-01-07 ENCOUNTER — Encounter (HOSPITAL_COMMUNITY)
Admission: RE | Disposition: A | Discharge status: Home / Self Care | Payer: Self-pay | Source: Ambulatory Visit | Attending: Obstetrics and Gynecology

## 2014-01-07 ENCOUNTER — Ambulatory Visit (HOSPITAL_COMMUNITY)
Admission: RE | Admit: 2014-01-07 | Discharge: 2014-01-08 | Disposition: A | Discharge status: Home / Self Care | Payer: 59 | Source: Ambulatory Visit | Attending: Obstetrics and Gynecology | Admitting: Obstetrics and Gynecology

## 2014-01-07 ENCOUNTER — Encounter (HOSPITAL_COMMUNITY): Payer: Self-pay | Admitting: *Deleted

## 2014-01-07 ENCOUNTER — Encounter (HOSPITAL_COMMUNITY): Payer: 59 | Admitting: Anesthesiology

## 2014-01-07 ENCOUNTER — Ambulatory Visit (HOSPITAL_COMMUNITY): Payer: 59 | Admitting: Anesthesiology

## 2014-01-07 DIAGNOSIS — N8000 Endometriosis of the uterus, unspecified: Secondary | ICD-10-CM | POA: Insufficient documentation

## 2014-01-07 DIAGNOSIS — G8929 Other chronic pain: Secondary | ICD-10-CM | POA: Insufficient documentation

## 2014-01-07 DIAGNOSIS — N854 Malposition of uterus: Secondary | ICD-10-CM | POA: Insufficient documentation

## 2014-01-07 DIAGNOSIS — N72 Inflammatory disease of cervix uteri: Secondary | ICD-10-CM | POA: Insufficient documentation

## 2014-01-07 DIAGNOSIS — Z880 Allergy status to penicillin: Secondary | ICD-10-CM | POA: Insufficient documentation

## 2014-01-07 DIAGNOSIS — N831 Corpus luteum cyst of ovary, unspecified side: Secondary | ICD-10-CM | POA: Insufficient documentation

## 2014-01-07 DIAGNOSIS — N8300 Follicular cyst of ovary, unspecified side: Secondary | ICD-10-CM | POA: Insufficient documentation

## 2014-01-07 DIAGNOSIS — N949 Unspecified condition associated with female genital organs and menstrual cycle: Secondary | ICD-10-CM | POA: Insufficient documentation

## 2014-01-07 HISTORY — PX: LAPAROSCOPIC ASSISTED VAGINAL HYSTERECTOMY: SHX5398

## 2014-01-07 LAB — PREGNANCY, URINE: Preg Test, Ur: NEGATIVE

## 2014-01-07 SURGERY — HYSTERECTOMY, VAGINAL, LAPAROSCOPY-ASSISTED
Anesthesia: General | Site: Abdomen | Laterality: Right

## 2014-01-07 MED ORDER — HYDROMORPHONE HCL PF 1 MG/ML IJ SOLN
0.2500 mg | INTRAMUSCULAR | Status: DC | PRN
  Administered 2014-01-07 (×4): 0.5 mg via INTRAVENOUS

## 2014-01-07 MED ORDER — ROCURONIUM BROMIDE 100 MG/10ML IV SOLN
INTRAVENOUS | Status: AC
  Filled 2014-01-07: qty 1

## 2014-01-07 MED ORDER — KETOROLAC TROMETHAMINE 30 MG/ML IJ SOLN
INTRAMUSCULAR | Status: DC | PRN
  Administered 2014-01-07: 30 mg via INTRAVENOUS

## 2014-01-07 MED ORDER — PROPOFOL 10 MG/ML IV EMUL
INTRAVENOUS | Status: AC
  Filled 2014-01-07: qty 20

## 2014-01-07 MED ORDER — HYDROMORPHONE HCL PF 1 MG/ML IJ SOLN
INTRAMUSCULAR | Status: AC
  Filled 2014-01-07: qty 1

## 2014-01-07 MED ORDER — MORPHINE SULFATE (PF) 1 MG/ML IV SOLN
INTRAVENOUS | Status: DC
  Administered 2014-01-07 (×2): 3 mg via INTRAVENOUS
  Administered 2014-01-07: 11:00:00 via INTRAVENOUS
  Filled 2014-01-07: qty 25

## 2014-01-07 MED ORDER — NALOXONE HCL 0.4 MG/ML IJ SOLN: 0.4000 mg | INTRAMUSCULAR | Status: DC | PRN

## 2014-01-07 MED ORDER — LIDOCAINE HCL (CARDIAC) 20 MG/ML IV SOLN
INTRAVENOUS | Status: DC | PRN
  Administered 2014-01-07: 60 mg via INTRAVENOUS

## 2014-01-07 MED ORDER — KETOROLAC TROMETHAMINE 30 MG/ML IJ SOLN
30.0000 mg | Freq: Four times a day (QID) | INTRAMUSCULAR | Status: DC
  Administered 2014-01-07: 30 mg via INTRAVENOUS
  Filled 2014-01-07 (×2): qty 1

## 2014-01-07 MED ORDER — SODIUM CHLORIDE 0.9 % IJ SOLN: 9.0000 mL | INTRAMUSCULAR | Status: DC | PRN

## 2014-01-07 MED ORDER — LACTATED RINGERS IV SOLN
INTRAVENOUS | Status: DC
  Administered 2014-01-07 (×3): via INTRAVENOUS

## 2014-01-07 MED ORDER — HYDROCODONE-ACETAMINOPHEN 5-325 MG PO TABS
1.0000 | ORAL_TABLET | Freq: Four times a day (QID) | ORAL | Status: DC | PRN
  Administered 2014-01-07 – 2014-01-08 (×3): 2 via ORAL
  Filled 2014-01-07 (×3): qty 2

## 2014-01-07 MED ORDER — GLYCOPYRROLATE 0.2 MG/ML IJ SOLN
INTRAMUSCULAR | Status: DC | PRN
  Administered 2014-01-07: .4 mg via INTRAVENOUS

## 2014-01-07 MED ORDER — GLYCOPYRROLATE 0.2 MG/ML IJ SOLN
INTRAMUSCULAR | Status: AC
  Filled 2014-01-07: qty 2

## 2014-01-07 MED ORDER — ONDANSETRON HCL 4 MG/2ML IJ SOLN: 4.0000 mg | Freq: Four times a day (QID) | INTRAMUSCULAR | Status: DC | PRN

## 2014-01-07 MED ORDER — NEOSTIGMINE METHYLSULFATE 10 MG/10ML IV SOLN
INTRAVENOUS | Status: AC
  Filled 2014-01-07: qty 1

## 2014-01-07 MED ORDER — KETOROLAC TROMETHAMINE 30 MG/ML IJ SOLN: 30.0000 mg | Freq: Four times a day (QID) | INTRAMUSCULAR | Status: DC

## 2014-01-07 MED ORDER — SODIUM CHLORIDE 0.9 % IJ SOLN
INTRAMUSCULAR | Status: DC | PRN
  Administered 2014-01-07: 3 mL

## 2014-01-07 MED ORDER — FENTANYL CITRATE 0.05 MG/ML IJ SOLN
INTRAMUSCULAR | Status: AC
  Filled 2014-01-07: qty 5

## 2014-01-07 MED ORDER — FENTANYL CITRATE 0.05 MG/ML IJ SOLN
INTRAMUSCULAR | Status: DC | PRN
  Administered 2014-01-07 (×2): 50 ug via INTRAVENOUS
  Administered 2014-01-07: 100 ug via INTRAVENOUS
  Administered 2014-01-07: 50 ug via INTRAVENOUS

## 2014-01-07 MED ORDER — PROPOFOL 10 MG/ML IV BOLUS
INTRAVENOUS | Status: DC | PRN
  Administered 2014-01-07: 200 mg via INTRAVENOUS

## 2014-01-07 MED ORDER — MIDAZOLAM HCL 2 MG/2ML IJ SOLN
INTRAMUSCULAR | Status: DC | PRN
  Administered 2014-01-07: 2 mg via INTRAVENOUS

## 2014-01-07 MED ORDER — SODIUM CHLORIDE 0.9 % IJ SOLN
INTRAMUSCULAR | Status: AC
  Filled 2014-01-07: qty 20

## 2014-01-07 MED ORDER — ROCURONIUM BROMIDE 100 MG/10ML IV SOLN
INTRAVENOUS | Status: DC | PRN
  Administered 2014-01-07: 40 mg via INTRAVENOUS
  Administered 2014-01-07: 5 mg via INTRAVENOUS

## 2014-01-07 MED ORDER — OXYCODONE-ACETAMINOPHEN 5-325 MG PO TABS: 1.0000 | ORAL_TABLET | ORAL | Status: DC | PRN

## 2014-01-07 MED ORDER — BUTORPHANOL TARTRATE 1 MG/ML IJ SOLN: 1.0000 mg | INTRAMUSCULAR | Status: DC | PRN

## 2014-01-07 MED ORDER — LIDOCAINE HCL (CARDIAC) 20 MG/ML IV SOLN
INTRAVENOUS | Status: AC
  Filled 2014-01-07: qty 5

## 2014-01-07 MED ORDER — MIDAZOLAM HCL 2 MG/2ML IJ SOLN
INTRAMUSCULAR | Status: AC
  Filled 2014-01-07: qty 2

## 2014-01-07 MED ORDER — BUPIVACAINE HCL (PF) 0.25 % IJ SOLN
INTRAMUSCULAR | Status: AC
  Filled 2014-01-07: qty 30

## 2014-01-07 MED ORDER — ONDANSETRON HCL 4 MG PO TABS: 4.0000 mg | ORAL_TABLET | Freq: Four times a day (QID) | ORAL | Status: DC | PRN

## 2014-01-07 MED ORDER — BUPIVACAINE LIPOSOME 1.3 % IJ SUSP
20.0000 mL | Freq: Once | INTRAMUSCULAR | Status: DC
  Filled 2014-01-07: qty 20

## 2014-01-07 MED ORDER — DIPHENHYDRAMINE HCL 50 MG/ML IJ SOLN: 12.5000 mg | Freq: Four times a day (QID) | INTRAMUSCULAR | Status: DC | PRN

## 2014-01-07 MED ORDER — ONDANSETRON HCL 4 MG/2ML IJ SOLN
INTRAMUSCULAR | Status: DC | PRN
  Administered 2014-01-07: 4 mg via INTRAVENOUS

## 2014-01-07 MED ORDER — DEXAMETHASONE SODIUM PHOSPHATE 10 MG/ML IJ SOLN
INTRAMUSCULAR | Status: DC | PRN
  Administered 2014-01-07: 5 mg via INTRAVENOUS

## 2014-01-07 MED ORDER — DEXAMETHASONE SODIUM PHOSPHATE 10 MG/ML IJ SOLN
INTRAMUSCULAR | Status: AC
  Filled 2014-01-07: qty 1

## 2014-01-07 MED ORDER — HYDROMORPHONE HCL PF 1 MG/ML IJ SOLN
INTRAMUSCULAR | Status: AC
Start: 2014-01-07 — End: 2014-01-07
  Administered 2014-01-07: 0.5 mg via INTRAVENOUS
  Filled 2014-01-07: qty 1

## 2014-01-07 MED ORDER — ONDANSETRON HCL 4 MG/2ML IJ SOLN
INTRAMUSCULAR | Status: AC
  Filled 2014-01-07: qty 2

## 2014-01-07 MED ORDER — DIPHENHYDRAMINE HCL 12.5 MG/5ML PO ELIX: 12.5000 mg | ORAL_SOLUTION | Freq: Four times a day (QID) | ORAL | Status: DC | PRN

## 2014-01-07 MED ORDER — DEXTROSE IN LACTATED RINGERS 5 % IV SOLN
INTRAVENOUS | Status: DC
  Administered 2014-01-07 (×2): via INTRAVENOUS

## 2014-01-07 MED ORDER — BUPIVACAINE HCL (PF) 0.25 % IJ SOLN
INTRAMUSCULAR | Status: DC | PRN
  Administered 2014-01-07: 8 mL

## 2014-01-07 MED ORDER — IBUPROFEN 800 MG PO TABS
800.0000 mg | ORAL_TABLET | Freq: Three times a day (TID) | ORAL | Status: DC | PRN
  Administered 2014-01-07 – 2014-01-08 (×2): 800 mg via ORAL
  Filled 2014-01-07 (×2): qty 1

## 2014-01-07 MED ORDER — KETOROLAC TROMETHAMINE 30 MG/ML IJ SOLN: 30.0000 mg | Freq: Once | INTRAMUSCULAR | Status: DC

## 2014-01-07 MED ORDER — HYDROMORPHONE HCL PF 1 MG/ML IJ SOLN
INTRAMUSCULAR | Status: DC | PRN
Start: 2014-01-07 — End: 2014-01-07
  Administered 2014-01-07 (×2): 1 mg via INTRAVENOUS

## 2014-01-07 MED ORDER — HYDROCODONE-ACETAMINOPHEN 10-325 MG PO TABS: 1.0000 | ORAL_TABLET | Freq: Four times a day (QID) | ORAL | Status: DC | PRN

## 2014-01-07 MED ORDER — MENTHOL 3 MG MT LOZG: 1.0000 | LOZENGE | OROMUCOSAL | Status: DC | PRN

## 2014-01-07 MED ORDER — HYDROMORPHONE HCL PF 1 MG/ML IJ SOLN
INTRAMUSCULAR | Status: AC
  Administered 2014-01-07: 0.5 mg via INTRAVENOUS
  Filled 2014-01-07: qty 1

## 2014-01-07 MED ORDER — LACTATED RINGERS IR SOLN
Status: DC | PRN
  Administered 2014-01-07: 3000 mL

## 2014-01-07 MED ORDER — NEOSTIGMINE METHYLSULFATE 10 MG/10ML IV SOLN
INTRAVENOUS | Status: DC | PRN
  Administered 2014-01-07: 3 mg via INTRAVENOUS

## 2014-01-07 SURGICAL SUPPLY — 63 items
ADH SKN CLS APL DERMABOND .7 (GAUZE/BANDAGES/DRESSINGS) ×2
BARRIER ADHS 3X4 INTERCEED (GAUZE/BANDAGES/DRESSINGS) IMPLANT
BRR ADH 4X3 ABS CNTRL BYND (GAUZE/BANDAGES/DRESSINGS)
CABLE HIGH FREQUENCY MONO STRZ (ELECTRODE) IMPLANT
CANISTER SUCT 3000ML (MISCELLANEOUS) ×4 IMPLANT
CATH ROBINSON RED A/P 16FR (CATHETERS) IMPLANT
CLOSURE WOUND 1/4X4 (GAUZE/BANDAGES/DRESSINGS)
CLOTH BEACON ORANGE TIMEOUT ST (SAFETY) ×4 IMPLANT
CONT PATH 16OZ SNAP LID 3702 (MISCELLANEOUS) ×4 IMPLANT
COVER TABLE BACK 60X90 (DRAPES) ×4 IMPLANT
DECANTER SPIKE VIAL GLASS SM (MISCELLANEOUS) IMPLANT
DERMABOND ADVANCED (GAUZE/BANDAGES/DRESSINGS) ×2
DERMABOND ADVANCED .7 DNX12 (GAUZE/BANDAGES/DRESSINGS) ×3 IMPLANT
DRAPE WARM FLUID 44X44 (DRAPE) ×4 IMPLANT
DRSG COVADERM PLUS 2X2 (GAUZE/BANDAGES/DRESSINGS) ×3 IMPLANT
DRSG OPSITE POSTOP 4X10 (GAUZE/BANDAGES/DRESSINGS) ×4 IMPLANT
DURAPREP 26ML APPLICATOR (WOUND CARE) ×4 IMPLANT
ELECT LIGASURE SHORT 9 REUSE (ELECTRODE) ×4 IMPLANT
ELECT REM PT RETURN 9FT ADLT (ELECTROSURGICAL) ×4
ELECTRODE REM PT RTRN 9FT ADLT (ELECTROSURGICAL) ×2 IMPLANT
GLOVE BIO SURGEON STRL SZ7 (GLOVE) ×8 IMPLANT
GLOVE BIO SURGEON STRL SZ7.5 (GLOVE) ×15 IMPLANT
GLOVE BIOGEL PI IND STRL 6.5 (GLOVE) ×2 IMPLANT
GLOVE BIOGEL PI IND STRL 7.0 (GLOVE) ×11 IMPLANT
GLOVE BIOGEL PI INDICATOR 6.5 (GLOVE) ×2
GLOVE BIOGEL PI INDICATOR 7.0 (GLOVE) ×18
GLOVE SURG SS PI 7.0 STRL IVOR (GLOVE) ×21 IMPLANT
GOWN STRL REUS W/TWL LRG LVL3 (GOWN DISPOSABLE) ×16 IMPLANT
NDL HYPO 18GX1.5 BLUNT FILL (NEEDLE) IMPLANT
NEEDLE HYPO 18GX1.5 BLUNT FILL (NEEDLE) IMPLANT
NEEDLE HYPO 22GX1.5 SAFETY (NEEDLE) ×4 IMPLANT
NEEDLE INSUFFLATION 120MM (ENDOMECHANICALS) ×4 IMPLANT
NS IRRIG 1000ML POUR BTL (IV SOLUTION) ×4 IMPLANT
PACK ABDOMINAL GYN (CUSTOM PROCEDURE TRAY) ×4 IMPLANT
PACK LAVH (CUSTOM PROCEDURE TRAY) ×4 IMPLANT
PAD OB MATERNITY 4.3X12.25 (PERSONAL CARE ITEMS) ×4 IMPLANT
PROTECTOR NERVE ULNAR (MISCELLANEOUS) ×4 IMPLANT
SEALER TISSUE G2 CVD JAW 45CM (ENDOMECHANICALS) ×4 IMPLANT
SET IRRIG TUBING LAPAROSCOPIC (IRRIGATION / IRRIGATOR) IMPLANT
SPONGE LAP 18X18 X RAY DECT (DISPOSABLE) ×8 IMPLANT
STRIP CLOSURE SKIN 1/4X4 (GAUZE/BANDAGES/DRESSINGS) IMPLANT
SUT CHROMIC 3 0 SH 27 (SUTURE) IMPLANT
SUT MON AB 2-0 CT1 36 (SUTURE) ×8 IMPLANT
SUT MON AB 4-0 PS1 27 (SUTURE) ×4 IMPLANT
SUT PDS AB 0 CT1 27 (SUTURE) ×8 IMPLANT
SUT VIC AB 0 CT1 18XCR BRD8 (SUTURE) ×6 IMPLANT
SUT VIC AB 0 CT1 27 (SUTURE) ×4
SUT VIC AB 0 CT1 27XBRD ANBCTR (SUTURE) ×2 IMPLANT
SUT VIC AB 0 CT1 36 (SUTURE) ×4 IMPLANT
SUT VIC AB 0 CT1 8-18 (SUTURE) ×12
SUT VIC AB 2-0 CT1 27 (SUTURE)
SUT VIC AB 2-0 CT1 TAPERPNT 27 (SUTURE) IMPLANT
SUT VIC AB 3-0 CT1 27 (SUTURE) ×4
SUT VIC AB 3-0 CT1 TAPERPNT 27 (SUTURE) ×2 IMPLANT
SUT VICRYL 0 TIES 12 18 (SUTURE) ×4 IMPLANT
SUT VICRYL 4-0 PS2 18IN ABS (SUTURE) ×4 IMPLANT
SYR 20CC LL (SYRINGE) ×8 IMPLANT
TOWEL OR 17X24 6PK STRL BLUE (TOWEL DISPOSABLE) ×8 IMPLANT
TRAY FOLEY CATH 14FR (SET/KITS/TRAYS/PACK) ×4 IMPLANT
TROCAR OPTI TIP 5M 100M (ENDOMECHANICALS) ×4 IMPLANT
TROCAR XCEL DIL TIP R 11M (ENDOMECHANICALS) ×4 IMPLANT
WARMER LAPAROSCOPE (MISCELLANEOUS) ×4 IMPLANT
WATER STERILE IRR 1000ML POUR (IV SOLUTION) ×4 IMPLANT

## 2014-01-07 NOTE — Anesthesia Postprocedure Evaluation (Signed)
  Anesthesia Post-op Note  Patient: Evelyn Little  Procedure(s) Performed: Procedure(s): LAPAROSCOPIC ASSISTED VAGINAL HYSTERECTOMY WITH RIGHT SALPINGO OOPHORECTOMY  (Right)  Patient Location: PACU and Women[facility] Unit  Anesthesia Type:General  Level of Consciousness: awake, alert  and oriented  Airway and Oxygen Therapy: Patient Spontanous Breathing  Post-op Pain: mild  Post-op Assessment: Patient's Cardiovascular Status Stable, Respiratory Function Stable, Patent Airway, Adequate PO intake and Pain level controlled  Post-op Vital Signs: Reviewed and stable  Last Vitals:  Filed Vitals:   01/07/14 1405  BP:   Pulse:   Temp:   Resp: 15    Complications: No apparent anesthesia complications

## 2014-01-07 NOTE — Op Note (Signed)
Preoperative diagnosis: Chronic pelvic pain, history of pelvic endometriosis  Postoperative diagnosis: Same  Procedure: LAVH, RSO  Surgeon: Marcelle Overlie  Assistant: McComb  EBL:250 cc  Specimens removed: Uterus, right tube and ovary, to pathology  Drains: Foley, to straight drain  Procedure and findings:  The patient taken the operating room after an adequate level of general anesthesia was obtained the patient's legs in stirrups the abdomen perineum and vagina were prepped and draped in the usual fashion for LAVH. In and out Foley catheter drain the bladder, EUA carried out uterus was midposition, mobile, normal size, adnexa negative. Hulka tenaculum was positioned. Prior to this appropriate timeout for taken.  Attention directed to the abdomen where the subumbilical area was infiltrated with quarter percent Marcaine plain, small incision was made in the varies needle was introduced without difficulty. Its intra-abdominal position was verified by pressure water testing. After a 2-1/2 L pneumoperitoneum syncopated, lap scopic trocar and sleeve were then introduced without difficulty. There was no evidence of any bleeding or trauma. 3 finger breaths above the symphysis in the midline, a 5 mm trocar was inserted under direct visualization. The patient was then placed in Trendelenburg and the pelvic findings as follows:   The upper abdomen was unremarkable, the cecum and appendix appeared to be normal  The pelvis, the uterus was normal size there was some old scarring in the cul-de-sac from endometriosis and in the anterior bladder flap there were some early stage endometriosis of the filmy nature. Both tubes been surgically interrupted the left ovary appeared to be normal, the right ovary was mobile otherwise appeared be normal except for a small cyst due to the majority of her pain being on the right side she requested conservation on the left and removal of the right ovary and in the circumstance. An  atraumatic grasper was then used to grasp the right tube and ovary which placed on traction toward the midline, the course of the right pelvic ureter was noted be well below, the in seal device was then used to coagulate and divide the right IP ligament down to and including the right round ligament. On the left side the utero-ovarian pedicle was coagulated and divided down to and including the left round ligament conserving the left ovary. These areas were hemostatic.   The vaginal portion the procedure was started at this point, the legs were extended weighted speculum was positioned, cervix grasped with tenaculum the cervical vaginal because a was incised circumferentially, posterior culdotomy performed without difficulty. The bladder was advanced superiorly with sharp and blunt dissection until the anterior peritoneal reflection could be identified, this was entered sharply and a retractor was then used to gently elevate the bladder out of the field. In sequential manner the uterosacral ligament, cardinal ligament and uterine vasculature pedicles were clamped divided and suture ligated with 0 Vicryl. The fundus of the uterus is in delivered posteriorly remaining pedicles clamped divided and free tie ligature with 0 Vicryl. Vaginal cuff was then closed from 3 to 9:00 with a running locked 2-0 Vicryl suture. Prior to closure sponge denies precast reported as correct x2. The vaginal mucosa was then closed right to left with 2-0 Monocryl interrupted sutures. This was hemostatic Foley catheter positioned draining clear urine. Repeat laparoscopy at that point with nasi irrigation revealed complete hemostasis even at reduced pressure. Transverse removed, gas less escape, the defect closed with 4-0 Vicryl subcuticular closure and Dermabond on the lower incision. She tolerated this well went to recovery room in good  condition.  Dictated with [facility]  Keiran Gaffey M. Milana ObeyHolland M.D.

## 2014-01-07 NOTE — Transfer of Care (Signed)
Immediate Anesthesia Transfer of Care Note  Patient: Evelyn Little  Procedure(s) Performed: Procedure(s): LAPAROSCOPIC ASSISTED VAGINAL HYSTERECTOMY WITH RIGHT SALPINGO OOPHORECTOMY  (Right)  Patient Location: PACU  Anesthesia Type:General  Level of Consciousness: awake, alert  and oriented  Airway & Oxygen Therapy: Patient Spontanous Breathing and Patient connected to nasal cannula oxygen  Post-op Assessment: Report given to PACU RN and Post -op Vital signs reviewed and stable  Post vital signs: Reviewed and stable  Complications: No apparent anesthesia complications

## 2014-01-07 NOTE — Progress Notes (Signed)
The patient was re-examined with no change in status 

## 2014-01-07 NOTE — Anesthesia Preprocedure Evaluation (Signed)
Anesthesia Evaluation  Patient identified by MRN, date of birth, ID band Patient awake    Reviewed: Allergy & Precautions, H&P , Patient's Chart, lab work & pertinent test results, reviewed documented beta blocker date and time   Airway Mallampati: II TM Distance: >3 FB Neck ROM: full    Dental no notable dental hx.    Pulmonary  breath sounds clear to auscultation  Pulmonary exam normal       Cardiovascular Rhythm:regular Rate:Normal     Neuro/Psych  Neuromuscular disease    GI/Hepatic   Endo/Other    Renal/GU      Musculoskeletal   Abdominal   Peds  Hematology   Anesthesia Other Findings Neurofibromatosis                 Reproductive/Obstetrics                           Anesthesia Physical Anesthesia Plan  ASA: II  Anesthesia Plan: General   Post-op Pain Management:    Induction: Intravenous  Airway Management Planned: Oral ETT  Additional Equipment:   Intra-op Plan:   Post-operative Plan: Extubation in OR  Informed Consent: I have reviewed the patients History and Physical, chart, labs and discussed the procedure including the risks, benefits and alternatives for the proposed anesthesia with the patient or authorized representative who has indicated his/her understanding and acceptance.   Dental Advisory Given and Dental advisory given  Plan Discussed with: CRNA and Surgeon  Anesthesia Plan Comments: (  Discussed general anesthesia, including possible nausea, instrumentation of airway, sore throat,pulmonary aspiration, etc. I asked if the were any outstanding questions, or  concerns before we proceeded. )        Anesthesia Quick Evaluation

## 2014-01-07 NOTE — Addendum Note (Signed)
Addendum created 01/07/14 1559 by Elbert Ewings, CRNA   Modules edited: Notes Section   Notes Section:  File: [id number]

## 2014-01-07 NOTE — Anesthesia Postprocedure Evaluation (Signed)
Anesthesia Post Note  Patient: Evelyn Little  Procedure(s) Performed: Procedure(s) (LRB): LAPAROSCOPIC ASSISTED VAGINAL HYSTERECTOMY WITH RIGHT SALPINGO OOPHORECTOMY  (Right)  Anesthesia type: General  Patient location: PACU  Post pain: Pain level controlled  Post assessment: Post-op Vital signs reviewed  Last Vitals:  Filed Vitals:   01/07/14 0945  BP: 117/67  Pulse: 78  Temp:   Resp: 21    Post vital signs: Reviewed  Level of consciousness: sedated  Complications: No apparent anesthesia complications

## 2014-01-08 ENCOUNTER — Encounter (HOSPITAL_COMMUNITY): Payer: Self-pay | Admitting: Obstetrics and Gynecology

## 2014-01-08 LAB — CBC
HEMATOCRIT: 35 % — AB (ref 36.0–46.0)
HEMOGLOBIN: 12.4 g/dL (ref 12.0–15.0)
MCH: 33.1 pg (ref 26.0–34.0)
MCHC: 35.4 g/dL (ref 30.0–36.0)
MCV: 93.3 fL (ref 78.0–100.0)
Platelets: 228 10*3/uL (ref 150–400)
RBC: 3.75 MIL/uL — ABNORMAL LOW (ref 3.87–5.11)
RDW: 12.6 % (ref 11.5–15.5)
WBC: 13 10*3/uL — ABNORMAL HIGH (ref 4.0–10.5)

## 2014-01-08 MED ORDER — IBUPROFEN 800 MG PO TABS: 800.0000 mg | ORAL_TABLET | Freq: Three times a day (TID) | ORAL | Status: DC | PRN

## 2014-01-08 MED ORDER — HYDROCODONE-ACETAMINOPHEN 10-325 MG PO TABS: 1.0000 | ORAL_TABLET | Freq: Four times a day (QID) | ORAL | Status: DC | PRN

## 2014-01-08 NOTE — Progress Notes (Signed)
Pt discharged home with significant other... Discharge instructions reviewed... Condition stable... No equipment... Ambulated to car with Stevphen Meuse, NT.

## 2014-01-08 NOTE — Discharge Summary (Signed)
Physician Discharge Summary  Patient ID: Evelyn Little MRN: [id number] DOB/AGE: 04/02/1976 38 y.o.  Admit date: 01/07/2014 Discharge date: 01/08/2014  Admission Diagnoses:chronic pelvic pain/endometriosis  Discharge Diagnoses: same Active Problems:   * No active hospital problems. *   Discharged Condition: good  Hospital Course: adm for LAVH RSO, D/C on POD #1, afeb, tol PO, abd exam soft + BS  Consults: None  Significant Diagnostic Studies: labs:  CBC    Component Value Date/Time   WBC 13.0* 01/08/2014 0538   RBC 3.75* 01/08/2014 0538   HGB 12.4 01/08/2014 0538   HCT 35.0* 01/08/2014 0538   PLT 228 01/08/2014 0538   MCV 93.3 01/08/2014 0538   MCH 33.1 01/08/2014 0538   MCHC 35.4 01/08/2014 0538   RDW 12.6 01/08/2014 0538      Treatments: surgery: LAVH RSO  Discharge Exam: Blood pressure 125/65, pulse 90, temperature 98.4 F (36.9 C), temperature source Oral, resp. rate 16, height 5\' 5"  (1.651 m), weight 136 lb (61.689 kg), SpO2 100.00%. Incs C/D, abd soft + BS  Disposition: 01-Home or Self Care     Medication List         HYDROcodone-acetaminophen 10-325 MG per tablet  Commonly known as:  NORCO  Take 1 tablet by mouth every 6 (six) hours as needed.     ibuprofen 800 MG tablet  Commonly known as:  ADVIL,MOTRIN  Take 1 tablet (800 mg total) by mouth every 8 (eight) hours as needed (mild pain).     multivitamin tablet  Take 1 tablet by mouth daily.     XANAX 1 MG tablet  Generic drug:  ALPRAZolam  Take 1 mg by mouth at bedtime.           Follow-up Information   Follow up with Meriel Pica, MD. Schedule an appointment as soon as possible for a visit in 2 weeks.   Specialty:  Obstetrics and Gynecology   Contact information:   [address] [place] [address] [phone]       Signed: Meriel Pica 01/08/2014, 8:24 AM

## 2014-06-15 ENCOUNTER — Encounter (HOSPITAL_COMMUNITY): Payer: Self-pay | Admitting: Obstetrics and Gynecology

## 2014-11-19 ENCOUNTER — Other Ambulatory Visit: Payer: Self-pay | Admitting: Obstetrics and Gynecology

## 2014-11-20 LAB — CYTOLOGY - PAP

## 2015-08-24 MED FILL — ALPRAZolam 1 MG TABS: 1 | 30 days supply | Qty: 120 | Fill #1

## 2015-09-23 MED FILL — ALPRAZolam 1 MG TABS: 1 | 30 days supply | Qty: 120 | Fill #0

## 2015-10-05 ENCOUNTER — Emergency Department (HOSPITAL_COMMUNITY)
Admission: EM | Admit: 2015-10-05 | Discharge: 2015-10-05 | Disposition: A | Discharge status: Home / Self Care | Payer: BLUE CROSS/BLUE SHIELD | Attending: Emergency Medicine | Admitting: Emergency Medicine

## 2015-10-05 ENCOUNTER — Emergency Department (HOSPITAL_COMMUNITY): Payer: BLUE CROSS/BLUE SHIELD

## 2015-10-05 ENCOUNTER — Encounter (HOSPITAL_COMMUNITY): Payer: Self-pay | Admitting: *Deleted

## 2015-10-05 DIAGNOSIS — Z880 Allergy status to penicillin: Secondary | ICD-10-CM | POA: Insufficient documentation

## 2015-10-05 DIAGNOSIS — M940 Chondrocostal junction syndrome [Tietze]: Secondary | ICD-10-CM

## 2015-10-05 DIAGNOSIS — Q8500 Neurofibromatosis, unspecified: Secondary | ICD-10-CM | POA: Insufficient documentation

## 2015-10-05 DIAGNOSIS — Z79899 Other long term (current) drug therapy: Secondary | ICD-10-CM | POA: Diagnosis not present

## 2015-10-05 DIAGNOSIS — Z87891 Personal history of nicotine dependence: Secondary | ICD-10-CM | POA: Insufficient documentation

## 2015-10-05 DIAGNOSIS — G43009 Migraine without aura, not intractable, without status migrainosus: Secondary | ICD-10-CM | POA: Insufficient documentation

## 2015-10-05 DIAGNOSIS — R079 Chest pain, unspecified: Secondary | ICD-10-CM | POA: Diagnosis present

## 2015-10-05 LAB — BASIC METABOLIC PANEL
Anion gap: 11 (ref 5–15)
BUN: 7 mg/dL (ref 6–20)
CALCIUM: 9 mg/dL (ref 8.9–10.3)
CO2: 21 mmol/L — AB (ref 22–32)
Chloride: 109 mmol/L (ref 101–111)
Creatinine, Ser: 0.76 mg/dL (ref 0.44–1.00)
GFR calc non Af Amer: >60 mL/min (ref >60)
Glucose, Bld: 107 mg/dL — ABNORMAL HIGH (ref 65–99)
Potassium: 3.2 mmol/L — ABNORMAL LOW (ref 3.5–5.1)
Sodium: 141 mmol/L (ref 135–145)

## 2015-10-05 LAB — CBC
HCT: 42.8 % (ref 36.0–46.0)
Hemoglobin: 15.7 g/dL — ABNORMAL HIGH (ref 12.0–15.0)
MCH: 34 pg (ref 26.0–34.0)
MCHC: 36.7 g/dL — ABNORMAL HIGH (ref 30.0–36.0)
MCV: 92.6 fL (ref 78.0–100.0)
Platelets: 226 10*3/uL (ref 150–400)
RBC: 4.62 MIL/uL (ref 3.87–5.11)
RDW: 12.4 % (ref 11.5–15.5)
WBC: 8.4 10*3/uL (ref 4.0–10.5)

## 2015-10-05 LAB — I-STAT TROPONIN, ED
TROPONIN I, POC: 0 ng/mL (ref 0.00–0.08)
Troponin i, poc: 0 ng/mL (ref 0.00–0.08)

## 2015-10-05 MED ORDER — NAPROXEN 500 MG PO TABS: ORAL_TABLET | ORAL | Status: DC

## 2015-10-05 MED ORDER — DIPHENHYDRAMINE HCL 50 MG/ML IJ SOLN
25.0000 mg | Freq: Once | INTRAMUSCULAR | Status: AC
  Administered 2015-10-05: 25 mg via INTRAVENOUS
  Filled 2015-10-05: qty 1

## 2015-10-05 MED ORDER — METOCLOPRAMIDE HCL 5 MG/ML IJ SOLN
10.0000 mg | Freq: Once | INTRAMUSCULAR | Status: AC
  Administered 2015-10-05: 10 mg via INTRAVENOUS
  Filled 2015-10-05: qty 2

## 2015-10-05 MED ORDER — SODIUM CHLORIDE 0.9 % IV BOLUS (SEPSIS)
1000.0000 mL | Freq: Once | INTRAVENOUS | Status: AC
  Administered 2015-10-05: 1000 mL via INTRAVENOUS

## 2015-10-05 MED ORDER — KETOROLAC TROMETHAMINE 30 MG/ML IJ SOLN
30.0000 mg | Freq: Once | INTRAMUSCULAR | Status: AC
  Administered 2015-10-05: 30 mg via INTRAVENOUS
  Filled 2015-10-05: qty 1

## 2015-10-05 MED ORDER — DEXAMETHASONE SODIUM PHOSPHATE 10 MG/ML IJ SOLN
10.0000 mg | Freq: Once | INTRAMUSCULAR | Status: AC
  Administered 2015-10-05: 10 mg via INTRAVENOUS
  Filled 2015-10-05: qty 1

## 2015-10-05 NOTE — ED Notes (Signed)
Pt alert & oriented x4, stable gait. Patient given discharge instructions, paperwork & prescription(s). Patient  instructed to stop at the registration desk to finish any additional paperwork. Patient verbalized understanding. Pt left department w/ no further questions. 

## 2015-10-05 NOTE — ED Provider Notes (Addendum)
CSN: [id number]     Arrival date & time 10/05/15  4098 History   First MD Initiated Contact with Patient 10/05/15 [phone]     Chief Complaint  Patient presents with  . Chest Pain     (Consider location/radiation/quality/duration/timing/severity/associated sxs/prior Treatment) HPI patient reports she was awakened from sleep at 1:30 this morning with pain in the center of her chest. She states it's a sharp and pressure feeling. The pain has been there constantly but waxes and wanes. Nothing she does makes her hurt more, nothing she does makes it feel better. She states she had it once before and was seen here in the ED a couple years ago and was discharged home. She states she did have some shortness of breath and had some mild clamminess but that is usual. She denies coughing, fever, nausea, or vomiting. She states she felt dizzy.  Patient also reports she's had a headache for the past 2 days. Her headache is frontal bilaterally and is constant. It also throbs. She has light and noise sensitive. She states she has some fuzzy black spots in her vision that fade and coming go. She denies any numbness or tingling in her extremities now but states she had them earlier when she was hyperventilating. She states she has migraine headaches about once a month and this headache is like the one she's had before.  Patient reports she is under a lot of stress at work and also away from work.  Family history is negative for coronary artery disease or migraines.   PCP Dr Hughie Closs  Past Medical History  Diagnosis Date  . Neurofibromatosis   . Vaginal delivery 1993, 2006   Past Surgical History  Procedure Laterality Date  . Diagnostic laparoscopy    . Laparoscopic assisted vaginal hysterectomy Right 01/07/2014    Procedure: LAPAROSCOPIC ASSISTED VAGINAL HYSTERECTOMY WITH RIGHT SALPINGO OOPHORECTOMY ;  Surgeon: Meriel Pica, MD;  Location: [facility];  Service: Gynecology;  Laterality: Right;  . Abdominal  hysterectomy     Family History  Problem Relation Age of Onset  . Hypertension Mother    Social History  Substance Use Topics  . Smoking status: Former Games developer  . Smokeless tobacco: Never Used  . Alcohol Use: Yes     Comment: occ   Employed Lives with spouse  OB History    Gravida Para Term Preterm AB TAB SAB Ectopic Multiple Living   Review of Systems  All other systems reviewed and are negative.     Allergies  Ampicillin; Penicillins; Bc powder; and Pseudoephedrine  Home Medications   Prior to Admission medications   Medication Sig Start Date End Date Taking? Authorizing Provider  ALPRAZolam Prudy Feeler) 1 MG tablet Take 1 mg by mouth at bedtime.    Yes Historical Provider, MD  HYDROcodone-acetaminophen (NORCO) 10-325 MG per tablet Take 1 tablet by mouth every 6 (six) hours as needed. 01/08/14   Richarda Overlie, MD  ibuprofen (ADVIL,MOTRIN) 800 MG tablet Take 1 tablet (800 mg total) by mouth every 8 (eight) hours as needed (mild pain). 01/08/14   Richarda Overlie, MD  Multiple Vitamin (MULTIVITAMIN) tablet Take 1 tablet by mouth daily.    Historical Provider, MD  naproxen (NAPROSYN) 500 MG tablet Take 1 po BID with food prn pain 10/05/15   Devoria Albe, MD   BP 139/81 mmHg  Pulse 77  Temp(Src) 98.1 F (36.7 C) (Oral)  Resp 28  Ht 5\' 4"  (1.626 m)  Wt 127 lb (57.607 kg)  BMI 21.79 kg/m2  SpO2 100%  LMP 10/13/2013  Vital signs normal   Physical Exam  Constitutional: She is oriented to person, place, and time. She appears well-developed and well-nourished.  Non-toxic appearance. She does not appear ill. No distress.  HENT:  Head: Normocephalic and atraumatic.  Right Ear: External ear normal.  Left Ear: External ear normal.  Nose: Nose normal. No mucosal edema or rhinorrhea.  Mouth/Throat: Oropharynx is clear and moist and mucous membranes are normal. No dental abscesses or uvula swelling.  Eyes: Conjunctivae and EOM are normal. Pupils are equal, round,  and reactive to light.  Neck: Normal range of motion and full passive range of motion without pain. Neck supple.  Cardiovascular: Normal rate, regular rhythm and normal heart sounds.  Exam reveals no gallop and no friction rub.   No murmur heard. Pulmonary/Chest: Effort normal and breath sounds normal. No respiratory distress. She has no wheezes. She has no rhonchi. She has no rales. She exhibits tenderness. She exhibits no crepitus.    Tender over the left costochondral junctions that reproduces her complaints of chest pain  Abdominal: Soft. Normal appearance and bowel sounds are normal. She exhibits no distension. There is no tenderness. There is no rebound and no guarding.  Musculoskeletal: Normal range of motion. She exhibits no edema or tenderness.  Moves all extremities well.   Neurological: She is alert and oriented to person, place, and time. She has normal strength. No cranial nerve deficit.  Skin: Skin is warm, dry and intact. No rash noted. No erythema. No pallor.  Patient's noted to have multiple scattered skin colored nodules on her face, trunk, extremities consistent with neurofibromatosis.  Psychiatric: She has a normal mood and affect. Her speech is normal and behavior is normal. Her mood appears not anxious.  Nursing note and vitals reviewed.   ED Course  Procedures (including critical care time)  Medications  sodium chloride 0.9 % bolus 1,000 mL (0 mLs Intravenous Stopped 10/05/15 0459)  metoCLOPramide (REGLAN) injection 10 mg (10 mg Intravenous Given 10/05/15 0359)  diphenhydrAMINE (BENADRYL) injection 25 mg (25 mg Intravenous Given 10/05/15 0359)  ketorolac (TORADOL) 30 MG/ML injection 30 mg (30 mg Intravenous Given 10/05/15 0359)  dexamethasone (DECADRON) injection 10 mg (10 mg Intravenous Given 10/05/15 0359)    Patient was given migraine cocktail for her migraine headache which includes Toradol which should also help with her costochondritis or chest wall  pain.  Recheck at 5 AM patient states her headaches almost gone, her chest pain is gone. We will get a second delta troponin and then if it is negative patient will be able to be discharged. Her chest pain appears to be musculoskeletal in origin. She has a history of migraines and this migraine sounds typical and was resolved with typical migraine medications.  Patient second delta troponin is 0. Patient was discharged home. She can take anti-inflammatory for her chest wall pain which will also help with her headache. She is to  be checked if she gets a fever, cough, struggles to breathe or her headache or chest pain seems worse.   Labs Review Results for orders placed or performed during the hospital encounter of 10/05/15  Basic metabolic panel  Result Value Ref Range   Sodium 141 135 - 145 mmol/L   Potassium 3.2 (L) 3.5 - 5.1 mmol/L   Chloride 109 101 - 111 mmol/L   CO2 21 (L) 22 - 32 mmol/L  Glucose, Bld 107 (H) 65 - 99 mg/dL   BUN 7 6 - 20 mg/dL   Creatinine, Ser 1.61 0.44 - 1.00 mg/dL   Calcium 9.0 8.9 - 09.6 mg/dL   GFR calc non Af Amer >60 >60 mL/min   GFR calc Af Amer >60 >60 mL/min   Anion gap 11 5 - 15  CBC  Result Value Ref Range   WBC 8.4 4.0 - 10.5 K/uL   RBC 4.62 3.87 - 5.11 MIL/uL   Hemoglobin 15.7 (H) 12.0 - 15.0 g/dL   HCT 04.5 40.9 - 81.1 %   MCV 92.6 78.0 - 100.0 fL   MCH 34.0 26.0 - 34.0 pg   MCHC 36.7 (H) 30.0 - 36.0 g/dL   RDW 91.4 78.2 - 95.6 %   Platelets 226 150 - 400 K/uL  I-stat troponin, ED (not at [facility], [facility])  Result Value Ref Range   Troponin i, poc 0.00 0.00 - 0.08 ng/mL   Comment 3          I-stat troponin, ED  Result Value Ref Range   Troponin i, poc 0.00 0.00 - 0.08 ng/mL   Comment 3           Laboratory interpretation all normal except hypokalemia     Imaging Review Dg Chest 2 View  10/05/2015  CLINICAL DATA:  Chest pain and headache this morning. EXAM: CHEST  2 VIEW COMPARISON:  08/27/2007 FINDINGS: The cardiomediastinal contours  are normal. The lungs are clear. Pulmonary vasculature is normal. No consolidation, pleural effusion, or pneumothorax. No acute osseous abnormalities are seen. IMPRESSION: No acute pulmonary process. Electronically Signed   By: Rubye Oaks M.D.   On: 10/05/2015 03:12   I have personally reviewed and evaluated these images and lab results as part of my medical decision-making.   EKG Interpretation   Date/Time:  Tuesday October 05 2015 02:42:43 EST Ventricular Rate:  78 PR Interval:  136 QRS Duration: 81 QT Interval:  385 QTC Calculation: 438 R Axis:   91 Text Interpretation:  Sinus rhythm Borderline right axis deviation  Borderline repolarization abnormality Baseline wander No significant  change since last tracing 19 Oct 2013 Confirmed by Yuri Flener  MD-I, Ayodele Sangalang  ([id number]) on 10/05/2015 2:49:30 AM      MDM   Final diagnoses:  Migraine without aura and without status migrainosus, not intractable  Costochondritis, acute    New Prescriptions   NAPROXEN (NAPROSYN) 500 MG TABLET    Take 1 po BID with food prn pain    Plan discharge  Devoria Albe, MD, Concha Pyo, MD 10/05/15 0530  Devoria Albe, MD 10/05/15 [phone]

## 2015-10-05 NOTE — ED Notes (Signed)
Pt states she woke last night around 2230 w/ chest pain & head hurting. Pain is to the central chest.

## 2015-10-05 NOTE — Discharge Instructions (Signed)
You can use ice or heat if needed for your chest pain. Take the naproxen if needed for your headache or your chest pain. Recheck if you get a fever, cough, struggle to breathe, or your headache gets severe.    Recurrent Migraine Headache A migraine headache is an intense, throbbing pain on one or both sides of your head. Recurrent migraines keep coming back. A migraine can last for 30 minutes to several hours. CAUSES  The exact cause of a migraine headache is not always known. However, a migraine may be caused when nerves in the brain become irritated and release chemicals that cause inflammation. This causes pain. Certain things may also trigger migraines, such as:   Alcohol.  Smoking.  Stress.  Menstruation.  Aged cheeses.  Foods or drinks that contain nitrates, glutamate, aspartame, or tyramine.  Lack of sleep.  Chocolate.  Caffeine.  Hunger.  Physical exertion.  Fatigue.  Medicines used to treat chest pain (nitroglycerine), birth control pills, estrogen, and some blood pressure medicines. SYMPTOMS   Pain on one or both sides of your head.  Pulsating or throbbing pain.  Severe pain that prevents daily activities.  Pain that is aggravated by any physical activity.  Nausea, vomiting, or both.  Dizziness.  Pain with exposure to bright lights, loud noises, or activity.  General sensitivity to bright lights, loud noises, or smells. Before you get a migraine, you may get warning signs that a migraine is coming (aura). An aura may include:  Seeing flashing lights.  Seeing bright spots, halos, or zigzag lines.  Having tunnel vision or blurred vision.  Having feelings of numbness or tingling.  Having trouble talking.  Having muscle weakness. DIAGNOSIS  A recurrent migraine headache is often diagnosed based on:  Symptoms.  Physical examination.  A CT scan or MRI of your head. These imaging tests cannot diagnose migraines but can help rule out other  causes of headaches.  TREATMENT  Medicines may be given for pain and nausea. Medicines can also be given to help prevent recurrent migraines. HOME CARE INSTRUCTIONS  Only take over-the-counter or prescription medicines for pain or discomfort as directed by your health care provider. The use of long-term narcotics is not recommended.  Lie down in a dark, quiet room when you have a migraine.  Keep a journal to find out what may trigger your migraine headaches. For example, write down:  What you eat and drink.  How much sleep you get.  Any change to your diet or medicines.  Limit alcohol consumption.  Quit smoking if you smoke.  Get 7-9 hours of sleep, or as recommended by your health care provider.  Limit stress.  Keep lights dim if bright lights bother you and make your migraines worse. SEEK MEDICAL CARE IF:   You do not get relief from the medicines given to you.  You have a recurrence of pain.  You have a fever. SEEK IMMEDIATE MEDICAL CARE IF:  Your migraine becomes severe.  You have a stiff neck.  You have loss of vision.  You have muscular weakness or loss of muscle control.  You start losing your balance or have trouble walking.  You feel faint or pass out.  You have severe symptoms that are different from your first symptoms. MAKE SURE YOU:   Understand these instructions.  Will watch your condition.  Will get help right away if you are not doing well or get worse.   This information is not intended to replace advice given  to you by your health care provider. Make sure you discuss any questions you have with your health care provider.   Document Released: 04/25/2001 Document Revised: 08/21/2014 Document Reviewed: 04/07/2013 Elsevier Interactive Patient Education 2016 [employer]  Costochondritis Costochondritis, sometimes called Tietze syndrome, is a swelling and irritation (inflammation) of the tissue (cartilage) that connects your ribs with  your breastbone (sternum). It causes pain in the chest and rib area. Costochondritis usually goes away on its own over time. It can take up to 6 weeks or longer to get better, especially if you are unable to limit your activities. CAUSES  Some cases of costochondritis have no known cause. Possible causes include:  Injury (trauma).  Exercise or activity such as lifting.  Severe coughing. SIGNS AND SYMPTOMS  Pain and tenderness in the chest and rib area.  Pain that gets worse when coughing or taking deep breaths.  Pain that gets worse with specific movements. DIAGNOSIS  Your health care provider will do a physical exam and ask about your symptoms. Chest X-rays or other tests may be done to rule out other problems. TREATMENT  Costochondritis usually goes away on its own over time. Your health care provider may prescribe medicine to help relieve pain. HOME CARE INSTRUCTIONS   Avoid exhausting physical activity. Try not to strain your ribs during normal activity. This would include any activities using chest, abdominal, and side muscles, especially if heavy weights are used.  Apply ice to the affected area for the first 2 days after the pain begins.  Put ice in a plastic bag.  Place a towel between your skin and the bag.  Leave the ice on for 20 minutes, 2-3 times a day.  Only take over-the-counter or prescription medicines as directed by your health care provider. SEEK MEDICAL CARE IF:  You have redness or swelling at the rib joints. These are signs of infection.  Your pain does not go away despite rest or medicine. SEEK IMMEDIATE MEDICAL CARE IF:   Your pain increases or you are very uncomfortable.  You have shortness of breath or difficulty breathing.  You cough up blood.  You have worse chest pains, sweating, or vomiting.  You have a fever or persistent symptoms for more than 2-3 days.  You have a fever and your symptoms suddenly get worse. MAKE SURE YOU:    Understand these instructions.  Will watch your condition.  Will get help right away if you are not doing well or get worse.   This information is not intended to replace advice given to you by your health care provider. Make sure you discuss any questions you have with your health care provider.   Document Released: 05/10/2005 Document Revised: 05/21/2013 Document Reviewed: 03/04/2013 Elsevier Interactive Patient Education [employer]

## 2015-10-14 MED FILL — HYDROCODON-APAP 5-325: 5-325 | 5 days supply | Qty: 30 | Fill #0

## 2015-10-14 MED FILL — ELINEST-28 TABLET: 0.3-30 | 28 days supply | Qty: 28 | Fill #0

## 2015-11-11 MED FILL — ELINEST-28 TABLET: 0.3-30 | 28 days supply | Qty: 28 | Fill #1

## 2016-01-11 ENCOUNTER — Encounter (HOSPITAL_COMMUNITY): Payer: Self-pay | Admitting: Emergency Medicine

## 2016-01-11 ENCOUNTER — Emergency Department (HOSPITAL_COMMUNITY): Payer: BLUE CROSS/BLUE SHIELD

## 2016-01-11 ENCOUNTER — Emergency Department (HOSPITAL_COMMUNITY)
Admission: EM | Admit: 2016-01-11 | Discharge: 2016-01-11 | Disposition: A | Discharge status: Home / Self Care | Payer: BLUE CROSS/BLUE SHIELD | Attending: Emergency Medicine | Admitting: Emergency Medicine

## 2016-01-11 DIAGNOSIS — Z880 Allergy status to penicillin: Secondary | ICD-10-CM | POA: Insufficient documentation

## 2016-01-11 DIAGNOSIS — R079 Chest pain, unspecified: Secondary | ICD-10-CM

## 2016-01-11 DIAGNOSIS — Q8500 Neurofibromatosis, unspecified: Secondary | ICD-10-CM | POA: Insufficient documentation

## 2016-01-11 DIAGNOSIS — I249 Acute ischemic heart disease, unspecified: Secondary | ICD-10-CM | POA: Insufficient documentation

## 2016-01-11 DIAGNOSIS — Z3202 Encounter for pregnancy test, result negative: Secondary | ICD-10-CM | POA: Diagnosis not present

## 2016-01-11 DIAGNOSIS — Z87891 Personal history of nicotine dependence: Secondary | ICD-10-CM | POA: Insufficient documentation

## 2016-01-11 DIAGNOSIS — F410 Panic disorder [episodic paroxysmal anxiety] without agoraphobia: Secondary | ICD-10-CM

## 2016-01-11 DIAGNOSIS — F419 Anxiety disorder, unspecified: Secondary | ICD-10-CM | POA: Insufficient documentation

## 2016-01-11 LAB — CBC
HCT: 42.1 % (ref 36.0–46.0)
HEMOGLOBIN: 14.6 g/dL (ref 12.0–15.0)
MCH: 32.4 pg (ref 26.0–34.0)
MCHC: 34.7 g/dL (ref 30.0–36.0)
MCV: 93.6 fL (ref 78.0–100.0)
Platelets: 197 10*3/uL (ref 150–400)
RBC: 4.5 MIL/uL (ref 3.87–5.11)
RDW: 12.3 % (ref 11.5–15.5)
WBC: 7.7 10*3/uL (ref 4.0–10.5)

## 2016-01-11 LAB — BASIC METABOLIC PANEL
ANION GAP: 8 (ref 5–15)
BUN: 10 mg/dL (ref 6–20)
CHLORIDE: 106 mmol/L (ref 101–111)
CO2: 24 mmol/L (ref 22–32)
Calcium: 9.2 mg/dL (ref 8.9–10.3)
Creatinine, Ser: 0.92 mg/dL (ref 0.44–1.00)
GFR calc non Af Amer: >60 mL/min (ref >60)
Glucose, Bld: 108 mg/dL — ABNORMAL HIGH (ref 65–99)
Potassium: 3.5 mmol/L (ref 3.5–5.1)
Sodium: 138 mmol/L (ref 135–145)

## 2016-01-11 LAB — D-DIMER, QUANTITATIVE (NOT AT ARMC): D-Dimer, Quant: 0.4 ug/mL-FEU (ref 0.00–0.50)

## 2016-01-11 LAB — I-STAT TROPONIN, ED: Troponin i, poc: 0 ng/mL (ref 0.00–0.08)

## 2016-01-11 LAB — I-STAT BETA HCG BLOOD, ED (MC, WL, AP ONLY): I-stat hCG, quantitative: <5 m[IU]/mL (ref <5)

## 2016-01-11 MED ORDER — ASPIRIN 81 MG PO CHEW
324.0000 mg | CHEWABLE_TABLET | Freq: Once | ORAL | Status: AC
Start: 2016-01-11 — End: 2016-01-11
  Administered 2016-01-11: 324 mg via ORAL
  Filled 2016-01-11: qty 4

## 2016-01-11 MED ORDER — DIAZEPAM 2 MG PO TABS: 2.0000 mg | ORAL_TABLET | Freq: Four times a day (QID) | ORAL | Status: DC | PRN

## 2016-01-11 MED ORDER — DIAZEPAM 2 MG PO TABS
2.0000 mg | ORAL_TABLET | Freq: Once | ORAL | Status: AC
  Administered 2016-01-11: 2 mg via ORAL
  Filled 2016-01-11: qty 1

## 2016-01-11 MED FILL — diazePAM 2 MG TABS: 2 | 3 days supply | Qty: 10 | Fill #0

## 2016-01-11 NOTE — ED Provider Notes (Signed)
CSN: [id number]     Arrival date & time 01/11/16  1147 History   First MD Initiated Contact with Patient 01/11/16 1243     Chief Complaint  Patient presents with  . Chest Pain     (Consider location/radiation/quality/duration/timing/severity/associated sxs/prior Treatment) HPI  Evelyn Little Is a 40 year old female with a past medical history of generalized anxiety disorder, and neurofibromatosis who presents emergency Department with chief complaint of chest pain. Patient states that on her way to work. She was driving when she had sudden onset of retrosternal chest pain, shortness of breath. Patient states that she felt like she developed an anxiety attack and began hyperventilating at which point she developed bilateral carpal paresthesia. She denies any currently. Patient states it is similar to her previous panic attacks. However she's never had chest pain. She continues to have chest pain at this point. She denies nausea, vomiting, diaphoresis. She is a nonsmoker with a past medical history of smoking. She denies a history of hypertension, hyperlipidemia or family history of MI. She has noticed some left ankle and foot swelling over the past several weeks. She denies a history of PE or DVT. Past Medical History  Diagnosis Date  . Neurofibromatosis   . Vaginal delivery 1993, 2006   Past Surgical History  Procedure Laterality Date  . Diagnostic laparoscopy    . Laparoscopic assisted vaginal hysterectomy Right 01/07/2014    Procedure: LAPAROSCOPIC ASSISTED VAGINAL HYSTERECTOMY WITH RIGHT SALPINGO OOPHORECTOMY ;  Surgeon: Meriel Picaichard M Holland, MD;  Location: [facility];  Service: Gynecology;  Laterality: Right;  . Abdominal hysterectomy     Family History  Problem Relation Age of Onset  . Hypertension Mother    Social History  Substance Use Topics  . Smoking status: Former Games developermoker  . Smokeless tobacco: Never Used  . Alcohol Use: Yes     Comment: occ   OB History    Gravida Para  Term Preterm AB TAB SAB Ectopic Multiple Living   2 2  2      2      Review of Systems  Ten systems reviewed and are negative for acute change, except as noted in the HPI.    Allergies  Ampicillin; Penicillins; Bc powder; and Pseudoephedrine  Home Medications   Prior to Admission medications   Medication Sig Start Date End Date Taking? Authorizing Provider  ibuprofen (ADVIL,MOTRIN) 800 MG tablet Take 1 tablet (800 mg total) by mouth every 8 (eight) hours as needed (mild pain). 01/08/14  Yes Richarda Overlieichard Holland, MD  diazepam (VALIUM) 2 MG tablet Take 1 tablet (2 mg total) by mouth every 6 (six) hours as needed for anxiety. 01/11/16   Arthor CaptainAbigail Laquanta Hummel, PA-C  HYDROcodone-acetaminophen (NORCO) 10-325 MG per tablet Take 1 tablet by mouth every 6 (six) hours as needed. Patient not taking: Reported on 01/11/2016 01/08/14   Richarda Overlieichard Holland, MD  naproxen (NAPROSYN) 500 MG tablet Take 1 po BID with food prn pain Patient not taking: Reported on 01/11/2016 10/05/15   Devoria AlbeIva Knapp, MD   BP 122/74 mmHg  Pulse 75  Temp(Src) 97.9 F (36.6 C) (Oral)  Resp 13  SpO2 99%  LMP 10/13/2013 Physical Exam  Constitutional: She is oriented to person, place, and time. She appears well-developed and well-nourished. No distress.  HENT:  Head: Normocephalic and atraumatic.  Eyes: Conjunctivae are normal. No scleral icterus.  Neck: Normal range of motion.  Cardiovascular: Normal rate, regular rhythm and normal heart sounds.  Exam reveals no gallop and no friction rub.  No murmur heard. Pulmonary/Chest: Effort normal and breath sounds normal. No respiratory distress. She exhibits no tenderness.  Abdominal: Soft. Bowel sounds are normal. She exhibits no distension and no mass. There is no tenderness. There is no guarding.  Neurological: She is alert and oriented to person, place, and time.  Skin: Skin is warm and dry. She is not diaphoretic.  Nursing note and vitals reviewed.   ED Course  Procedures (including critical  care time) Labs Review Labs Reviewed  BASIC METABOLIC PANEL - Abnormal; Notable for the following:    Glucose, Bld 108 (*)    All other components within normal limits  CBC  D-DIMER, QUANTITATIVE (NOT AT [facility])  I-STAT TROPOININ, ED  I-STAT BETA HCG BLOOD, ED ([facility], [facility], [facility] ONLY)    Imaging Review No results found. I have personally reviewed and evaluated these images and lab results as part of my medical decision-making.   EKG Interpretation   Date/Time:  Tuesday Jan 11 2016 11:49:16 EDT Ventricular Rate:  90 PR Interval:  128 QRS Duration: 76 QT Interval:  350 QTC Calculation: 428 R Axis:   103 Text Interpretation:  Normal sinus rhythm Rightward axis Borderline ECG No  significant change since last tracing Confirmed by KNAPP  MD-J, JON  ([id number]) on 01/12/2016 9:49:59 AM      MDM   Final diagnoses:  Chest pain with low risk of acute coronary syndrome  Anxiety attack    Patient with HEART SCORE of 1. Patient will be treated with anxiolytic. She has no cp at this time.  Follow up with pcp. PERC negative.    Arthor Captain, PA-C 01/13/16 1751  Doug Sou, MD 01/14/16 1558

## 2016-01-11 NOTE — ED Notes (Signed)
Pt rouses from sleep and speaks easily.

## 2016-01-11 NOTE — ED Notes (Signed)
Pt states she understands instructions. mHome with boyfriend.

## 2016-01-11 NOTE — Discharge Instructions (Signed)
Nonspecific Chest Pain  °Chest pain can be caused by many different conditions. There is always a chance that your pain could be related to something serious, such as a heart attack or a blood clot in your lungs. Chest pain can also be caused by conditions that are not life-threatening. If you have chest pain, it is very important to follow up with your health care provider. °CAUSES  °Chest pain can be caused by: °· Heartburn. °· Pneumonia or bronchitis. °· Anxiety or stress. °· Inflammation around your heart (pericarditis) or lung (pleuritis or pleurisy). °· A blood clot in your lung. °· A collapsed lung (pneumothorax). It can develop suddenly on its own (spontaneous pneumothorax) or from trauma to the chest. °· Shingles infection (varicella-zoster virus). °· Heart attack. °· Damage to the bones, muscles, and cartilage that make up your chest wall. This can include: °· Bruised bones due to injury. °· Strained muscles or cartilage due to frequent or repeated coughing or overwork. °· Fracture to one or more ribs. °· Sore cartilage due to inflammation (costochondritis). °RISK FACTORS  °Risk factors for chest pain may include: °· Activities that increase your risk for trauma or injury to your chest. °· Respiratory infections or conditions that cause frequent coughing. °· Medical conditions or overeating that can cause heartburn. °· Heart disease or family history of heart disease. °· Conditions or health behaviors that increase your risk of developing a blood clot. °· Having had chicken pox (varicella zoster). °SIGNS AND SYMPTOMS °Chest pain can feel like: °· Burning or tingling on the surface of your chest or deep in your chest. °· Crushing, pressure, aching, or squeezing pain. °· Dull or sharp pain that is worse when you move, cough, or take a deep breath. °· Pain that is also felt in your back, neck, shoulder, or arm, or pain that spreads to any of these areas. °Your chest pain may come and go, or it may stay  constant. °DIAGNOSIS °Lab tests or other studies may be needed to find the cause of your pain. Your health care provider may have you take a test called an ambulatory ECG (electrocardiogram). An ECG records your heartbeat patterns at the time the test is performed. You may also have other tests, such as: °· Transthoracic echocardiogram (TTE). During echocardiography, sound waves are used to create a picture of all of the heart structures and to look at how blood flows through your heart. °· Transesophageal echocardiogram (TEE). This is a more advanced imaging test that obtains images from inside your body. It allows your health care provider to see your heart in finer detail. °· Cardiac monitoring. This allows your health care provider to monitor your heart rate and rhythm in real time. °· Holter monitor. This is a portable device that records your heartbeat and can help to diagnose abnormal heartbeats. It allows your health care provider to track your heart activity for several days, if needed. °· Stress tests. These can be done through exercise or by taking medicine that makes your heart beat more quickly. °· Blood tests. °· Imaging tests. °TREATMENT  °Your treatment depends on what is causing your chest pain. Treatment may include: °· Medicines. These may include: °· Acid blockers for heartburn. °· Anti-inflammatory medicine. °· Pain medicine for inflammatory conditions. °· Antibiotic medicine, if an infection is present. °· Medicines to dissolve blood clots. °· Medicines to treat coronary artery disease. °· Supportive care for conditions that do not require medicines. This may include: °· Resting. °· Applying heat   or cold packs to injured areas. °· Limiting activities until pain decreases. °HOME CARE INSTRUCTIONS °· If you were prescribed an antibiotic medicine, finish it all even if you start to feel better. °· Avoid any activities that bring on chest pain. °· Do not use any tobacco products, including  cigarettes, chewing tobacco, or electronic cigarettes. If you need help quitting, ask your health care provider. °· Do not drink alcohol. °· Take medicines only as directed by your health care provider. °· Keep all follow-up visits as directed by your health care provider. This is important. This includes any further testing if your chest pain does not go away. °· If heartburn is the cause for your chest pain, you may be told to keep your head raised (elevated) while sleeping. This reduces the chance that acid will go from your stomach into your esophagus. °· Make lifestyle changes as directed by your health care provider. These may include: °· Getting regular exercise. Ask your health care provider to suggest some activities that are safe for you. °· Eating a heart-healthy diet. A registered dietitian can help you to learn healthy eating options. °· Maintaining a healthy weight. °· Managing diabetes, if necessary. °· Reducing stress. °SEEK MEDICAL CARE IF: °· Your chest pain does not go away after treatment. °· You have a rash with blisters on your chest. °· You have a fever. °SEEK IMMEDIATE MEDICAL CARE IF:  °· Your chest pain is worse. °· You have an increasing cough, or you cough up blood. °· You have severe abdominal pain. °· You have severe weakness. °· You faint. °· You have chills. °· You have sudden, unexplained chest discomfort. °· You have sudden, unexplained discomfort in your arms, back, neck, or jaw. °· You have shortness of breath at any time. °· You suddenly start to sweat, or your skin gets clammy. °· You feel nauseous or you vomit. °· You suddenly feel light-headed or dizzy. °· Your heart begins to beat quickly, or it feels like it is skipping beats. °These symptoms may represent a serious problem that is an emergency. Do not wait to see if the symptoms will go away. Get medical help right away. Call your local emergency services (911 in the [place] Do not drive yourself to the hospital. °  °This  information is not intended to replace advice given to you by your health care provider. Make sure you discuss any questions you have with your health care provider. °  °Document Released: 05/10/2005 Document Revised: 08/21/2014 Document Reviewed: 03/06/2014 °Elsevier Interactive Patient Education ©2016 [employer] ° °Panic Attacks °Panic attacks are sudden, short-lived surges of severe anxiety, fear, or discomfort. They may occur for no reason when you are relaxed, when you are anxious, or when you are sleeping. Panic attacks may occur for a number of reasons:  °· Healthy people occasionally have panic attacks in extreme, life-threatening situations, such as war or natural disasters. Normal anxiety is a protective mechanism of the body that helps us react to danger (fight or flight response). °· Panic attacks are often seen with anxiety disorders, such as panic disorder, social anxiety disorder, generalized anxiety disorder, and phobias. Anxiety disorders cause excessive or uncontrollable anxiety. They may interfere with your relationships or other life activities. °· Panic attacks are sometimes seen with other mental illnesses, such as depression and posttraumatic stress disorder. °· Certain medical conditions, prescription medicines, and drugs of abuse can cause panic attacks. °SYMPTOMS  °Panic attacks start suddenly, peak within 20 minutes, and are   accompanied by four or more of the following symptoms: °· Pounding heart or fast heart rate (palpitations). °· Sweating. °· Trembling or shaking. °· Shortness of breath or feeling smothered. °· Feeling choked. °· Chest pain or discomfort. °· Nausea or strange feeling in your stomach. °· Dizziness, light-headedness, or feeling like you will faint. °· Chills or hot flushes. °· Numbness or tingling in your lips or hands and feet. °· Feeling that things are not real or feeling that you are not yourself. °· Fear of losing control or going crazy. °· Fear of dying. °Some  of these symptoms can mimic serious medical conditions. For example, you may think you are having a heart attack. Although panic attacks can be very scary, they are not life threatening. °DIAGNOSIS  °Panic attacks are diagnosed through an assessment by your health care provider. Your health care provider will ask questions about your symptoms, such as where and when they occurred. Your health care provider will also ask about your medical history and use of alcohol and drugs, including prescription medicines. Your health care provider may order blood tests or other studies to rule out a serious medical condition. Your health care provider may refer you to a mental health professional for further evaluation. °TREATMENT  °· Most healthy people who have one or two panic attacks in an extreme, life-threatening situation will not require treatment. °· The treatment for panic attacks associated with anxiety disorders or other mental illness typically involves counseling with a mental health professional, medicine, or a combination of both. Your health care provider will help determine what treatment is best for you. °· Panic attacks due to physical illness usually go away with treatment of the illness. If prescription medicine is causing panic attacks, talk with your health care provider about stopping the medicine, decreasing the dose, or substituting another medicine. °· Panic attacks due to alcohol or drug abuse go away with abstinence. Some adults need professional help in order to stop drinking or using drugs. °HOME CARE INSTRUCTIONS  °· Take all medicines as directed by your health care provider.   °· Schedule and attend follow-up visits as directed by your health care provider. It is important to keep all your appointments. °SEEK MEDICAL CARE IF: °· You are not able to take your medicines as prescribed. °· Your symptoms do not improve or get worse. °SEEK IMMEDIATE MEDICAL CARE IF:  °· You experience panic attack  symptoms that are different than your usual symptoms. °· You have serious thoughts about hurting yourself or others. °· You are taking medicine for panic attacks and have a serious side effect. °MAKE SURE YOU: °· Understand these instructions. °· Will watch your condition. °· Will get help right away if you are not doing well or get worse. °  °This information is not intended to replace advice given to you by your health care provider. Make sure you discuss any questions you have with your health care provider. °  °Document Released: 07/31/2005 Document Revised: 08/05/2013 Document Reviewed: 03/14/2013 °Elsevier Interactive Patient Education ©2016 [employer] ° °

## 2016-01-11 NOTE — ED Notes (Signed)
Pt sts mid sternal CP worse with inspiration this am with some SOB and tingling in both hands

## 2016-01-21 MED FILL — ESCITALOPRAM 10 MG TABLET: 10 | 30 days supply | Qty: 30 | Fill #0

## 2016-07-24 ENCOUNTER — Encounter (HOSPITAL_COMMUNITY): Payer: Self-pay | Admitting: Emergency Medicine

## 2016-07-24 ENCOUNTER — Emergency Department (HOSPITAL_COMMUNITY)
Admission: EM | Admit: 2016-07-24 | Discharge: 2016-07-25 | Disposition: A | Discharge status: Home / Self Care | Payer: BLUE CROSS/BLUE SHIELD | Attending: Emergency Medicine | Admitting: Emergency Medicine

## 2016-07-24 ENCOUNTER — Emergency Department (HOSPITAL_COMMUNITY): Payer: BLUE CROSS/BLUE SHIELD

## 2016-07-24 DIAGNOSIS — N83202 Unspecified ovarian cyst, left side: Secondary | ICD-10-CM | POA: Diagnosis not present

## 2016-07-24 DIAGNOSIS — R1032 Left lower quadrant pain: Secondary | ICD-10-CM | POA: Diagnosis present

## 2016-07-24 DIAGNOSIS — Z87891 Personal history of nicotine dependence: Secondary | ICD-10-CM | POA: Insufficient documentation

## 2016-07-24 DIAGNOSIS — R109 Unspecified abdominal pain: Secondary | ICD-10-CM

## 2016-07-24 LAB — URINALYSIS, ROUTINE W REFLEX MICROSCOPIC
BILIRUBIN URINE: NEGATIVE
Bacteria, UA: NONE SEEN
GLUCOSE, UA: NEGATIVE mg/dL
KETONES UR: NEGATIVE mg/dL
LEUKOCYTES UA: NEGATIVE
NITRITE: NEGATIVE
PH: 6 (ref 5.0–8.0)
Protein, ur: 100 mg/dL — AB
Specific Gravity, Urine: 1.008 (ref 1.005–1.030)

## 2016-07-24 LAB — PREGNANCY, URINE: Preg Test, Ur: NEGATIVE

## 2016-07-24 NOTE — ED Provider Notes (Signed)
[facility]PT Provider Note   CSN: [id number] Arrival date & time: 07/24/16  2236     History   Chief Complaint Chief Complaint  Patient presents with  . Flank Pain    HPI Kathreen CornfieldDana M Mangal is a 40 y.o. female.  The history is provided by the patient. No language interpreter was used.  Flank Pain  This is a new problem. The current episode started 6 to 12 hours ago. The problem occurs constantly. The problem has not changed since onset.Nothing aggravates the symptoms. Nothing relieves the symptoms. She has tried nothing for the symptoms.  Pt reports she thinks she has a kidney stone.  Pt began taking cipro one week ago for uti symptoms.  Pt reports today she began having sharp pain in her back.  Past Medical History:  Diagnosis Date  . Neurofibromatosis   . Vaginal delivery 1993, 2006    There are no active problems to display for this patient.   Past Surgical History:  Procedure Laterality Date  . ABDOMINAL HYSTERECTOMY    . DIAGNOSTIC LAPAROSCOPY    . LAPAROSCOPIC ASSISTED VAGINAL HYSTERECTOMY Right 01/07/2014   Procedure: LAPAROSCOPIC ASSISTED VAGINAL HYSTERECTOMY WITH RIGHT SALPINGO OOPHORECTOMY ;  Surgeon: Meriel Picaichard M Holland, MD;  Location: [facility];  Service: Gynecology;  Laterality: Right;    OB History    Gravida Para Term Preterm AB Living   2 2   2   2    SAB TAB Ectopic Multiple Live Births                   Home Medications    Prior to Admission medications   Medication Sig Start Date End Date Taking? Authorizing Provider  diazepam (VALIUM) 2 MG tablet Take 1 tablet (2 mg total) by mouth every 6 (six) hours as needed for anxiety. 01/11/16   Arthor CaptainAbigail Harris, PA-C  ibuprofen (ADVIL,MOTRIN) 800 MG tablet Take 1 tablet (800 mg total) by mouth every 8 (eight) hours as needed (mild pain). 01/08/14   Richarda Overlieichard Holland, MD  naproxen (NAPROSYN) 500 MG tablet Take 1 po BID with food prn pain Patient not taking: Reported on 01/11/2016 10/05/15   Devoria AlbeIva Knapp, MD     Family History Family History  Problem Relation Age of Onset  . Hypertension Mother     Social History Social History  Substance Use Topics  . Smoking status: Former Games developermoker  . Smokeless tobacco: Never Used  . Alcohol use Yes     Comment: occ     Allergies   Ampicillin; Penicillins; Bc powder [aspirin-salicylamide-caffeine]; and Pseudoephedrine   Review of Systems Review of Systems  Genitourinary: Positive for flank pain.  All other systems reviewed and are negative.    Physical Exam Updated Vital Signs BP 116/59 (BP Location: Left Arm)   Pulse 83   Temp 98.2 F (36.8 C) (Oral)   Resp 16   Ht 5\' 5"  (1.651 m)   Wt 57.6 kg   LMP 10/13/2013   SpO2 100%   BMI 21.13 kg/m   Physical Exam  Constitutional: She is oriented to person, place, and time. She appears well-developed and well-nourished.  HENT:  Head: Normocephalic.  Right Ear: External ear normal.  Left Ear: External ear normal.  Eyes: EOM are normal.  Neck: Normal range of motion.  Cardiovascular: Normal rate and regular rhythm.   Pulmonary/Chest: Effort normal.  Abdominal: Soft. She exhibits no distension.  Musculoskeletal: Normal range of motion.  Neurological: She is alert and oriented to person, place,  and time.  Skin: Skin is warm.  Psychiatric: She has a normal mood and affect.  Nursing note and vitals reviewed.    ED Treatments / Results  Labs (all labs ordered are listed, but only abnormal results are displayed) Labs Reviewed  URINALYSIS, ROUTINE W REFLEX MICROSCOPIC - Abnormal; Notable for the following:       Result Value   APPearance HAZY (*)    Hgb urine dipstick SMALL (*)    Protein, ur 100 (*)    All other components within normal limits  PREGNANCY, URINE    EKG  EKG Interpretation None       Radiology No results found.  Procedures Procedures (including critical care time)  Medications Ordered in ED Medications - No data to display   Initial Impression /  Assessment and Plan / ED Course  I have reviewed the triage vital signs and the nursing notes.  Pertinent labs & imaging results that were available during my care of the patient were reviewed by me and considered in my medical decision making (see chart for details).  Clinical Course    Pt has small amount of blood and protein in urine.  I suspect partially treated uti.  Ct shows hydronephrosis probably second to infection.  No stones.  Pt has neurofibromatosis causing skin cyst. Radiologist advised ultrasound to look at ovarian cyst.    Pt declined pain medication.  She reports she is not currently hurting.    Final Clinical Impressions(s) / ED Diagnoses   Final diagnoses:  Flank pain  Cyst of left ovary    New Prescriptions New Prescriptions   CEPHALEXIN (KEFLEX) 500 MG CAPSULE    Take 1 capsule (500 mg total) by mouth 4 (four) times daily.   HYDROCODONE-ACETAMINOPHEN (NORCO/VICODIN) 5-325 MG TABLET    Take 2 tablets by mouth every 4 (four) hours as needed.  An After Visit Summary was printed and given to the patient.   Lonia SkinnerLeslie K TalihinaSofia, PA-C 07/25/16 0008    Jacalyn LefevreJulie Haviland, MD 07/25/16 [phone]

## 2016-07-24 NOTE — ED Triage Notes (Signed)
Pt report LLQ pain that started at approx 1430 today. Pt states the pain spread from her LLQ into her L flank and is now in both flanks. Pt had started taking Cipro for UTI symptoms that started last Monday night. Pt has taken 11 Cipro.

## 2016-07-25 ENCOUNTER — Other Ambulatory Visit (HOSPITAL_COMMUNITY): Payer: Self-pay | Admitting: Physician Assistant

## 2016-07-25 ENCOUNTER — Ambulatory Visit (HOSPITAL_COMMUNITY)
Admission: RE | Admit: 2016-07-25 | Discharge: 2016-07-25 | Disposition: A | Discharge status: Home / Self Care | Payer: BLUE CROSS/BLUE SHIELD | Source: Ambulatory Visit | Attending: Family Medicine | Admitting: Family Medicine

## 2016-07-25 DIAGNOSIS — N839 Noninflammatory disorder of ovary, fallopian tube and broad ligament, unspecified: Secondary | ICD-10-CM | POA: Insufficient documentation

## 2016-07-25 DIAGNOSIS — N838 Other noninflammatory disorders of ovary, fallopian tube and broad ligament: Secondary | ICD-10-CM

## 2016-07-25 MED ORDER — CEPHALEXIN 500 MG PO CAPS
500.0000 mg | ORAL_CAPSULE | Freq: Four times a day (QID) | ORAL | 0 refills | Status: AC
Start: 2016-07-25 — End: ?

## 2016-07-25 MED ORDER — HYDROCODONE-ACETAMINOPHEN 5-325 MG PO TABS
2.0000 | ORAL_TABLET | Freq: Once | ORAL | Status: AC
  Administered 2016-07-25: 2 via ORAL
  Filled 2016-07-25: qty 2

## 2016-07-25 MED ORDER — HYDROCODONE-ACETAMINOPHEN 5-325 MG PO TABS: 2.0000 | ORAL_TABLET | ORAL | 0 refills | Status: DC | PRN

## 2016-07-25 NOTE — ED Provider Notes (Signed)
Pelvic ultrasound discussed with patient. She understands that ultrasound was abnormal and she will need gynecological follow-up. Possibility of cancer discussed. [place] in [place] Dr. Marcelle OverlieHolland. She will call him for a follow-up appointment. I discussed that she will need an MRI of her abdomen and a follow-up ultrasound. She understands all these instructions. Discharge medications Phenergan 25 mg.   Donnetta HutchingBrian Latroya Ng, MD 07/25/16 [id number]

## 2016-07-25 NOTE — Discharge Instructions (Signed)
Return for ultrasound.  Take antibiotics as directed.  Pain medication if needed.  See Dr. Phillips OdorGolding for recheck in 1 week

## 2016-10-13 ENCOUNTER — Ambulatory Visit (INDEPENDENT_AMBULATORY_CARE_PROVIDER_SITE_OTHER): Payer: 59 | Admitting: Orthopedic Surgery

## 2018-07-08 ENCOUNTER — Other Ambulatory Visit: Payer: Self-pay | Admitting: Obstetrics and Gynecology

## 2018-07-08 DIAGNOSIS — R928 Other abnormal and inconclusive findings on diagnostic imaging of breast: Secondary | ICD-10-CM

## 2018-07-10 ENCOUNTER — Ambulatory Visit
Admission: RE | Admit: 2018-07-10 | Discharge: 2018-07-10 | Disposition: A | Discharge status: Home / Self Care | Payer: BLUE CROSS/BLUE SHIELD | Source: Ambulatory Visit | Attending: Obstetrics and Gynecology | Admitting: Obstetrics and Gynecology

## 2018-07-10 ENCOUNTER — Other Ambulatory Visit: Payer: Self-pay | Admitting: Obstetrics and Gynecology

## 2018-07-10 DIAGNOSIS — R928 Other abnormal and inconclusive findings on diagnostic imaging of breast: Secondary | ICD-10-CM

## 2018-09-04 ENCOUNTER — Other Ambulatory Visit (HOSPITAL_COMMUNITY): Payer: Self-pay | Admitting: Family

## 2018-09-04 ENCOUNTER — Ambulatory Visit (HOSPITAL_COMMUNITY)
Admission: RE | Admit: 2018-09-04 | Discharge: 2018-09-04 | Disposition: A | Discharge status: Home / Self Care | Payer: BLUE CROSS/BLUE SHIELD | Source: Ambulatory Visit | Attending: Family | Admitting: Family

## 2018-09-04 DIAGNOSIS — R059 Cough, unspecified: Secondary | ICD-10-CM

## 2018-09-04 DIAGNOSIS — R05 Cough: Secondary | ICD-10-CM | POA: Insufficient documentation

## 2019-08-29 IMAGING — DX DG CHEST 2V
2 series · 2 of 2 positions shown · non-contrast
Comparison: 01/11/2016

CLINICAL DATA: Cough and congestion for 2 weeks

EXAM:
CHEST - 2 VIEW

[chest pa]
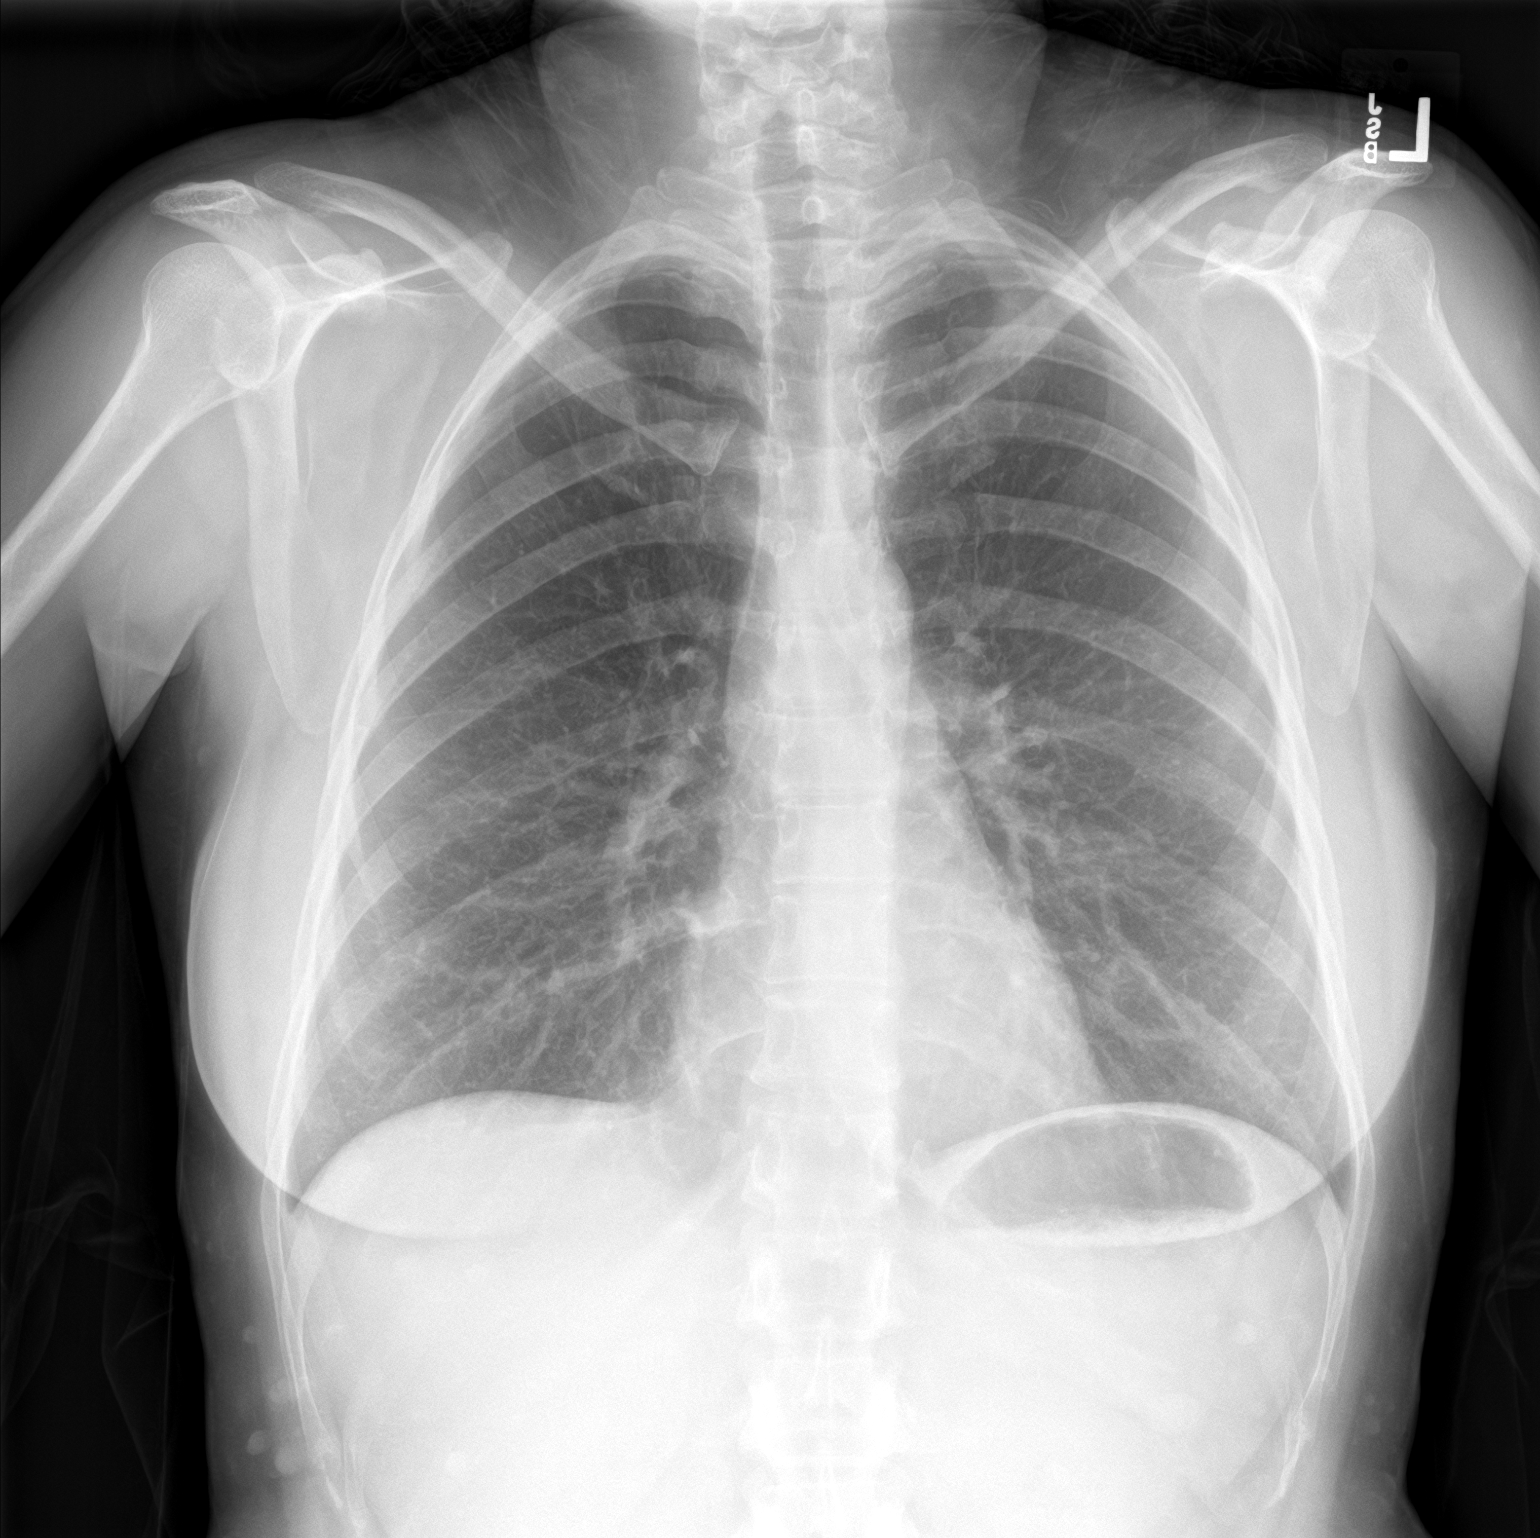

[chest lat]
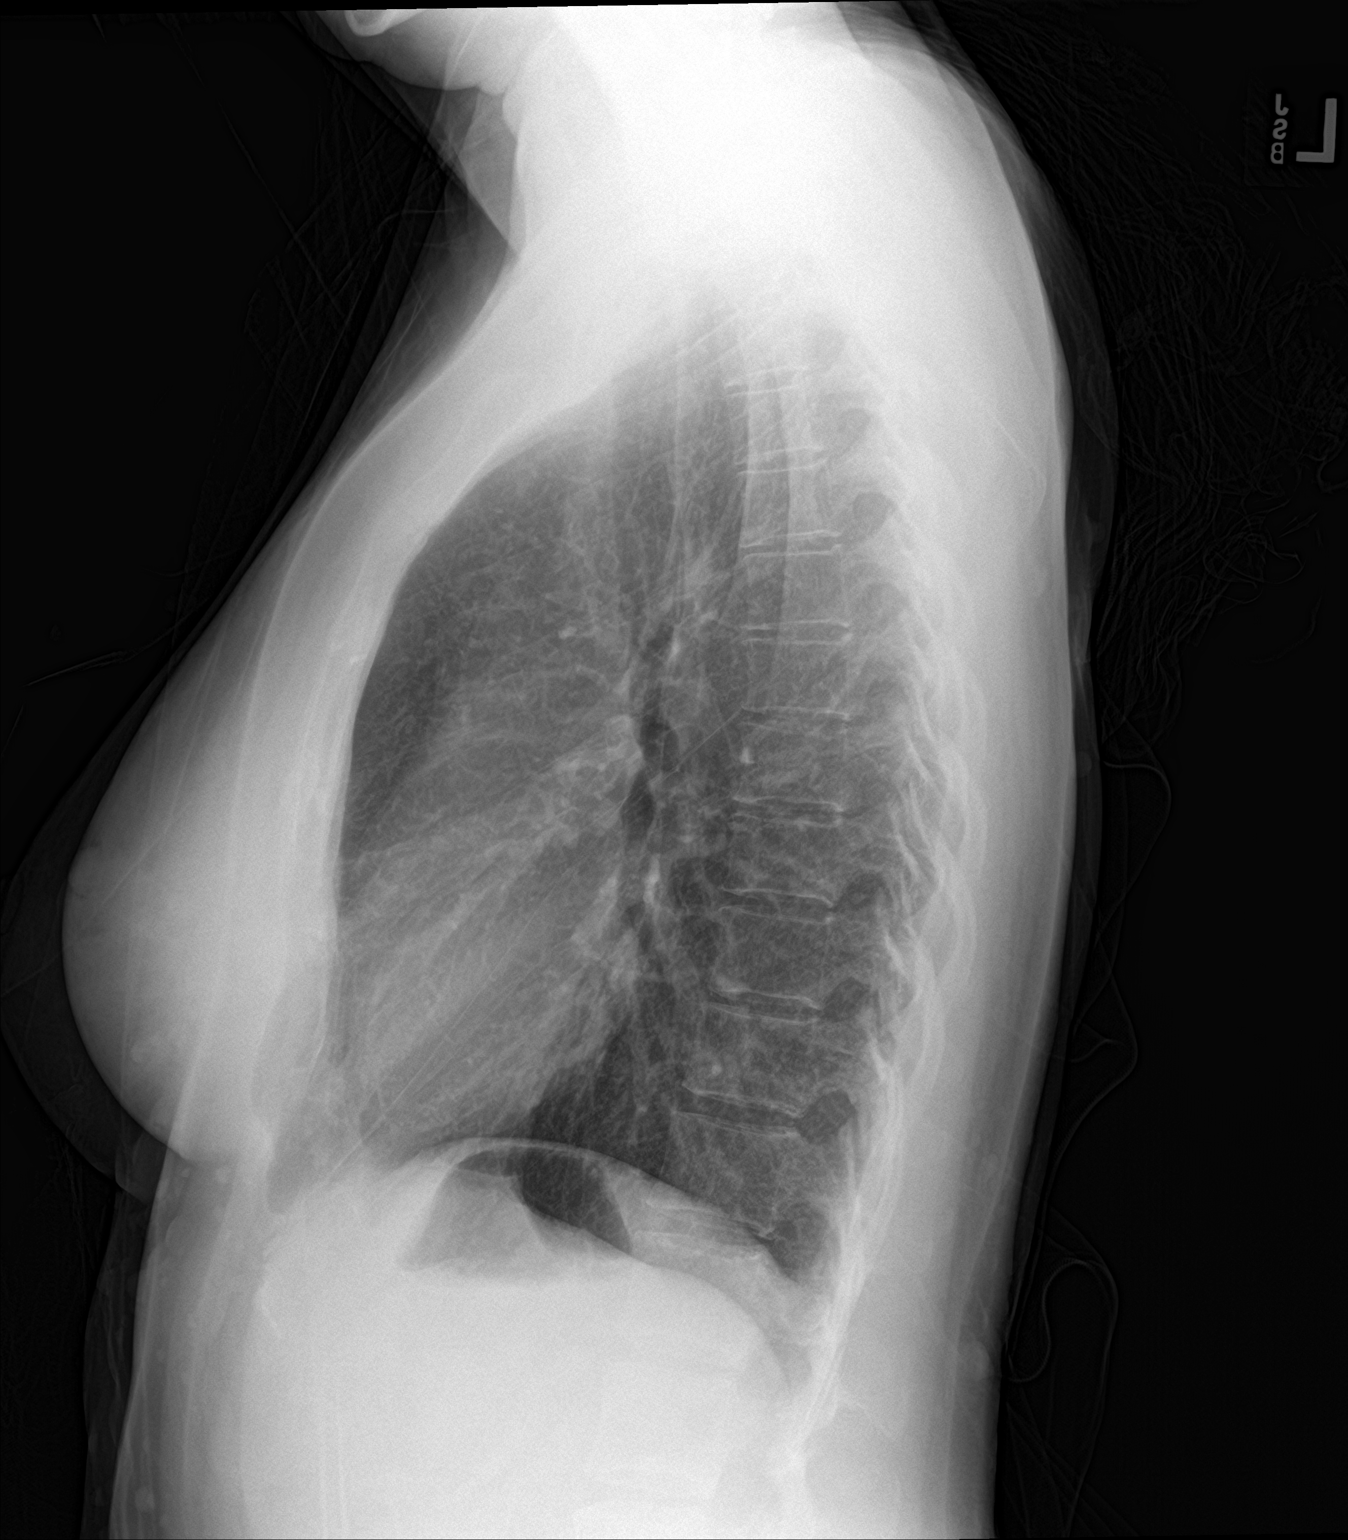

[2 of 2 positions shown; findings below may reference images not displayed]

FINDINGS: The heart size and mediastinal contours are within normal limits.
Both lungs are clear. The visualized skeletal structures are
unremarkable.
IMPRESSION: No active cardiopulmonary disease.

## 2020-02-18 ENCOUNTER — Other Ambulatory Visit: Payer: Self-pay

## 2020-02-18 ENCOUNTER — Emergency Department (HOSPITAL_COMMUNITY)
Admission: EM | Admit: 2020-02-18 | Discharge: 2020-02-19 | Disposition: A | Discharge status: Home / Self Care | Payer: Medicaid Other | Attending: Emergency Medicine | Admitting: Emergency Medicine

## 2020-02-18 ENCOUNTER — Encounter (HOSPITAL_COMMUNITY): Payer: Self-pay | Admitting: *Deleted

## 2020-02-18 ENCOUNTER — Emergency Department (HOSPITAL_COMMUNITY): Payer: Medicaid Other

## 2020-02-18 DIAGNOSIS — Z87891 Personal history of nicotine dependence: Secondary | ICD-10-CM | POA: Insufficient documentation

## 2020-02-18 DIAGNOSIS — R1030 Lower abdominal pain, unspecified: Secondary | ICD-10-CM

## 2020-02-18 DIAGNOSIS — R42 Dizziness and giddiness: Secondary | ICD-10-CM | POA: Diagnosis not present

## 2020-02-18 DIAGNOSIS — R110 Nausea: Secondary | ICD-10-CM | POA: Diagnosis not present

## 2020-02-18 DIAGNOSIS — R1032 Left lower quadrant pain: Secondary | ICD-10-CM | POA: Insufficient documentation

## 2020-02-18 DIAGNOSIS — R1031 Right lower quadrant pain: Secondary | ICD-10-CM | POA: Diagnosis not present

## 2020-02-18 LAB — CBC WITH DIFFERENTIAL/PLATELET
Abs Immature Granulocytes: 0.02 10*3/uL (ref 0.00–0.07)
Basophils Absolute: 0.1 10*3/uL (ref 0.0–0.1)
Basophils Relative: 1 %
Eosinophils Absolute: 0.5 10*3/uL (ref 0.0–0.5)
Eosinophils Relative: 6 %
HCT: 48.1 % — ABNORMAL HIGH (ref 36.0–46.0)
Hemoglobin: 16.6 g/dL — ABNORMAL HIGH (ref 12.0–15.0)
Immature Granulocytes: 0 %
Lymphocytes Relative: 29 %
Lymphs Abs: 2.5 10*3/uL (ref 0.7–4.0)
MCH: 32.7 pg (ref 26.0–34.0)
MCHC: 34.5 g/dL (ref 30.0–36.0)
MCV: 94.9 fL (ref 80.0–100.0)
Monocytes Absolute: 0.5 10*3/uL (ref 0.1–1.0)
Monocytes Relative: 6 %
Neutro Abs: 5 10*3/uL (ref 1.7–7.7)
Neutrophils Relative %: 58 %
Platelets: 293 10*3/uL (ref 150–400)
RBC: 5.07 MIL/uL (ref 3.87–5.11)
RDW: 12.7 % (ref 11.5–15.5)
WBC: 8.6 10*3/uL (ref 4.0–10.5)
nRBC: 0 % (ref 0.0–0.2)

## 2020-02-18 LAB — URINALYSIS, ROUTINE W REFLEX MICROSCOPIC
Bilirubin Urine: NEGATIVE
Glucose, UA: NEGATIVE mg/dL
Ketones, ur: NEGATIVE mg/dL
Leukocytes,Ua: NEGATIVE
Nitrite: NEGATIVE
Protein, ur: NEGATIVE mg/dL
Specific Gravity, Urine: 1.003 — ABNORMAL LOW (ref 1.005–1.030)
pH: 7 (ref 5.0–8.0)

## 2020-02-18 LAB — BASIC METABOLIC PANEL
Anion gap: 8 (ref 5–15)
BUN: 12 mg/dL (ref 6–20)
CO2: 23 mmol/L (ref 22–32)
Calcium: 9.2 mg/dL (ref 8.9–10.3)
Chloride: 102 mmol/L (ref 98–111)
Creatinine, Ser: 1.01 mg/dL — ABNORMAL HIGH (ref 0.44–1.00)
GFR calc Af Amer: >60 mL/min (ref >60)
GFR calc non Af Amer: >60 mL/min (ref >60)
Glucose, Bld: 89 mg/dL (ref 70–99)
Potassium: 4.6 mmol/L (ref 3.5–5.1)
Sodium: 133 mmol/L — ABNORMAL LOW (ref 135–145)

## 2020-02-18 MED ORDER — FENTANYL CITRATE (PF) 100 MCG/2ML IJ SOLN
50.0000 ug | Freq: Once | INTRAMUSCULAR | Status: AC
  Administered 2020-02-18: 50 ug via INTRAVENOUS
  Filled 2020-02-18: qty 2

## 2020-02-18 MED ORDER — ONDANSETRON HCL 4 MG PO TABS: 4.0000 mg | ORAL_TABLET | Freq: Three times a day (TID) | ORAL | 0 refills | Status: AC | PRN

## 2020-02-18 MED ORDER — HYDROCODONE-ACETAMINOPHEN 5-325 MG PO TABS: 1.0000 | ORAL_TABLET | Freq: Four times a day (QID) | ORAL | 0 refills | Status: AC | PRN

## 2020-02-18 MED ORDER — IOHEXOL 300 MG/ML  SOLN
100.0000 mL | Freq: Once | INTRAMUSCULAR | Status: AC | PRN
  Administered 2020-02-18: 100 mL via INTRAVENOUS

## 2020-02-18 MED ORDER — ONDANSETRON HCL 4 MG/2ML IJ SOLN
4.0000 mg | Freq: Once | INTRAMUSCULAR | Status: AC
  Administered 2020-02-18: 4 mg via INTRAVENOUS
  Filled 2020-02-18: qty 2

## 2020-02-18 NOTE — ED Triage Notes (Signed)
Pt with lower abd pain for ongoing for a week.  Seen PCP Thursday and blood noted in urine and treated for UTI.  Pt states pain worse, + nausea.  Denies burning with urination.

## 2020-02-18 NOTE — ED Provider Notes (Signed)
[facility] EMERGENCY DEPARTMENT Provider Note   CSN: [id number] Arrival date & time: 02/18/20  1446     History Chief Complaint  Patient presents with  . Abdominal Pain    Evelyn Little is a 44 y.o. female.  She is complaining of lower abdominal pain that began almost a week ago.  Crampy in nature associated with nausea.  She says her urine was dark.  Saw her primary care doctor who said she had blood in her urine and put her on Bactrim.  Does not seem to change any symptoms.  Drinking more fluids.  Pain across the whole lower abdomen.  No vaginal bleeding or discharge.  Prior history of hysterectomy and single ovary removal.  Does not know which side.  Normal bowel movements.  No real dysuria or frequency.  No fevers or chills.  The history is provided by the patient.  Abdominal Pain Pain location:  Suprapubic, LLQ and RLQ Pain quality: cramping   Pain radiates to:  Does not radiate Pain severity:  Severe Onset quality:  Gradual Timing:  Intermittent Progression:  Worsening Chronicity:  New Context: not recent illness, not sick contacts and not trauma   Relieved by:  Nothing Worsened by:  Nothing Ineffective treatments: bactrim, flomax, tramadol. Associated symptoms: hematuria and nausea   Associated symptoms: no chest pain, no chills, no constipation, no cough, no diarrhea, no dysuria, no fever, no hematemesis, no hematochezia, no shortness of breath, no sore throat, no vaginal bleeding, no vaginal discharge and no vomiting        Past Medical History:  Diagnosis Date  . Neurofibromatosis   . Vaginal delivery 1993, 2006    There are no problems to display for this patient.   Past Surgical History:  Procedure Laterality Date  . ABDOMINAL HYSTERECTOMY    . DIAGNOSTIC LAPAROSCOPY    . LAPAROSCOPIC ASSISTED VAGINAL HYSTERECTOMY Right 01/07/2014   Procedure: LAPAROSCOPIC ASSISTED VAGINAL HYSTERECTOMY WITH RIGHT SALPINGO OOPHORECTOMY ;  Surgeon: Meriel Pica,  MD;  Location: [facility];  Service: Gynecology;  Laterality: Right;     OB History    Gravida  2   Para  2   Term      Preterm  2   AB      Living  2     SAB      TAB      Ectopic      Multiple      Live Births              Family History  Problem Relation Age of Onset  . Hypertension Mother     Social History   Tobacco Use  . Smoking status: Former Games developer  . Smokeless tobacco: Never Used  Substance Use Topics  . Alcohol use: Yes    Comment: occ  . Drug use: No    Home Medications Prior to Admission medications   Medication Sig Start Date End Date Taking? Authorizing Provider  cephALEXin (KEFLEX) 500 MG capsule Take 1 capsule (500 mg total) by mouth 4 (four) times daily. 07/25/16   Elson Areas, PA-C  ciprofloxacin (CIPRO) 500 MG tablet Take 500 mg by mouth 2 (two) times daily.    [provider]  HYDROcodone-acetaminophen (NORCO/VICODIN) 5-325 MG tablet Take 2 tablets by mouth every 4 (four) hours as needed. 07/25/16   Elson Areas, PA-C    Allergies    Ampicillin, Penicillins, Bc powder [aspirin-salicylamide-caffeine], and Pseudoephedrine  Review of Systems   Review  of Systems  Constitutional: Negative for chills and fever.  HENT: Negative for sore throat.   Eyes: Negative for visual disturbance.  Respiratory: Negative for cough and shortness of breath.   Cardiovascular: Negative for chest pain.  Gastrointestinal: Positive for abdominal pain and nausea. Negative for constipation, diarrhea, hematemesis, hematochezia and vomiting.  Genitourinary: Positive for hematuria. Negative for dysuria, vaginal bleeding and vaginal discharge.  Musculoskeletal: Negative for neck pain.  Skin: Negative for rash.  Neurological: Positive for light-headedness.    Physical Exam Updated Vital Signs BP (!) 143/80 (BP Location: Right Arm)   Pulse 85   Temp 98.5 F (36.9 C) (Oral)   Resp 14   Ht 5\' 5"  (1.651 m)   Wt 66.7 kg   LMP 10/13/2013    SpO2 100%   BMI 24.46 kg/m   Physical Exam Vitals and nursing note reviewed.  Constitutional:      General: She is not in acute distress.    Appearance: She is well-developed.  HENT:     Head: Normocephalic and atraumatic.  Eyes:     Conjunctiva/sclera: Conjunctivae normal.  Cardiovascular:     Rate and Rhythm: Normal rate and regular rhythm.     Heart sounds: No murmur heard.   Pulmonary:     Effort: Pulmonary effort is normal. No respiratory distress.     Breath sounds: Normal breath sounds.  Abdominal:     Palpations: Abdomen is soft.     Tenderness: There is abdominal tenderness in the right lower quadrant, suprapubic area and left lower quadrant. There is no guarding or rebound.  Musculoskeletal:        General: No deformity or signs of injury. Normal range of motion.     Cervical back: Neck supple.  Skin:    General: Skin is warm and dry.     Capillary Refill: Capillary refill takes less than 2 seconds.     Comments: Multiple skin nodules consistent with her neurofibromatosis  Neurological:     General: No focal deficit present.     Mental Status: She is alert.     ED Results / Procedures / Treatments   Labs (all labs ordered are listed, but only abnormal results are displayed) Labs Reviewed  URINALYSIS, ROUTINE W REFLEX MICROSCOPIC - Abnormal; Notable for the following components:      Result Value   Color, Urine STRAW (*)    Specific Gravity, Urine 1.003 (*)    Hgb urine dipstick SMALL (*)    Bacteria, UA RARE (*)    All other components within normal limits  BASIC METABOLIC PANEL - Abnormal; Notable for the following components:   Sodium 133 (*)    Creatinine, Ser 1.01 (*)    All other components within normal limits  CBC WITH DIFFERENTIAL/PLATELET - Abnormal; Notable for the following components:   Hemoglobin 16.6 (*)    HCT 48.1 (*)    All other components within normal limits    EKG None  Radiology CT Abdomen Pelvis W Contrast  Result Date:  02/18/2020 CLINICAL DATA:  Right lower quadrant pain. EXAM: CT ABDOMEN AND PELVIS WITH CONTRAST TECHNIQUE: Multidetector CT imaging of the abdomen and pelvis was performed using the standard protocol following bolus administration of intravenous contrast. CONTRAST:  100mL OMNIPAQUE IOHEXOL 300 MG/ML  SOLN COMPARISON:  May 24, 2016 FINDINGS: Lower chest: No acute abnormality. Hepatobiliary: No focal liver abnormality is seen. No gallstones, gallbladder wall thickening, or biliary dilatation. Pancreas: Unremarkable. No pancreatic ductal dilatation or surrounding inflammatory changes.  Spleen: Normal in size without focal abnormality. Adrenals/Urinary Tract: Adrenal glands are unremarkable. Kidneys are normal in size, without renal calculi or hydronephrosis. Subcentimeter simple cysts are seen within the right kidney. Bladder is unremarkable. Stomach/Bowel: Stomach is within normal limits. Appendix appears normal. No evidence of bowel wall thickening, distention, or inflammatory changes. Vascular/Lymphatic: No significant vascular findings are present. No enlarged abdominal or pelvic lymph nodes. Reproductive: Status post hysterectomy. No adnexal masses. Other: No abdominal wall hernia or abnormality. No abdominopelvic ascites. Musculoskeletal: Mild endplate sclerosis is seen along the anterior aspect of the superior endplate of the L4 vertebral body. IMPRESSION: 1. Small simple right renal cyst. Electronically Signed   By: Aram Candela M.D.   On: 02/18/2020 18:56    Procedures Procedures (including critical care time)  Medications Ordered in ED Medications  ondansetron (ZOFRAN) injection 4 mg (has no administration in time range)  fentaNYL (SUBLIMAZE) injection 50 mcg (has no administration in time range)    ED Course  I have reviewed the triage vital signs and the nursing notes.  Pertinent labs & imaging results that were available during my care of the patient were reviewed by me and considered  in my medical decision making (see chart for details).  Clinical Course as of Feb 19 1115  Wed Feb 18, 2020  1910 Patient's work-up has been fairly unremarkable.  No evidence of urine infection normal white count and CT not showing any obvious explanation for her symptoms.  I reviewed all this with her.  She said she would follow-up with her gynecologist and her primary care doctor.  Will prescribe her a short course of some pain and nausea medication.  Return instructions discussed   [MB]    Clinical Course User Index [MB] Terrilee Files, MD   MDM Rules/Calculators/A&P                         This patient complains of low abdominal pain with some nausea lightheadedness hematuria; this involves an extensive number of treatment Options and is a complaint that carries with it a high risk of complications and Morbidity. The differential includes UTI, renal colic, adhesions, endometriosis, diverticulitis, appendicitis  I ordered, reviewed and interpreted labs, which included CBC with normal white count slightly elevated hemoglobin question dehydration, chemistries fairly normal, urinalysis without signs of infection. Patient had a prior hysterectomy so pregnancy test not ordered I ordered medication IV fluids and pain medication I ordered imaging studies which included CT abdomen pelvis and I independently    visualized and interpreted imaging which showed no acute findings Previous records obtained and reviewed in epic, no recent ED visits  After the interventions stated above, I reevaluated the patient and found patient to be symptomatically improved. I reviewed her labs and imaging with her. She still does have a single ovary although the story does not sound like ovarian torsion. Offered to schedule her a pelvic ultrasound for the morning. She said she would rather follow-up with her gynecologist and will return if any worsening or concerning symptoms   Final Clinical Impression(s) / ED  Diagnoses Final diagnoses:  Lower abdominal pain  Nausea  Dizziness    Rx / DC Orders ED Discharge Orders         Ordered    HYDROcodone-acetaminophen (NORCO/VICODIN) 5-325 MG tablet  Every 6 hours PRN     Discontinue  Reprint     02/18/20 1912    ondansetron (ZOFRAN) 4 MG tablet  Every 8 hours PRN     Discontinue  Reprint     02/18/20 1912           Terrilee Files, MD 02/19/20 1118

## 2020-02-18 NOTE — Discharge Instructions (Addendum)
You were seen in the emergency department for lower abdominal pain associated with some nausea and dizziness.  You had blood work urinalysis and a CAT scan that did not show any obvious explanation for your symptoms.  Please continue to drink plenty of water.  We are prescribing some nausea and pain medication for short-term relief.  Contact your primary care doctor and gynecologist for close follow-up.  Return to the emergency department for any worsening or concerning symptoms.

## 2020-08-04 ENCOUNTER — Encounter (HOSPITAL_COMMUNITY): Payer: Self-pay

## 2020-08-04 ENCOUNTER — Emergency Department (HOSPITAL_COMMUNITY)
Admission: EM | Admit: 2020-08-04 | Discharge: 2020-08-04 | Disposition: A | Discharge status: Home / Self Care | Payer: 59 | Attending: Emergency Medicine | Admitting: Emergency Medicine

## 2020-08-04 ENCOUNTER — Other Ambulatory Visit: Payer: Self-pay

## 2020-08-04 DIAGNOSIS — R42 Dizziness and giddiness: Secondary | ICD-10-CM | POA: Insufficient documentation

## 2020-08-04 DIAGNOSIS — Z87891 Personal history of nicotine dependence: Secondary | ICD-10-CM | POA: Diagnosis not present

## 2020-08-04 DIAGNOSIS — R0789 Other chest pain: Secondary | ICD-10-CM | POA: Insufficient documentation

## 2020-08-04 LAB — CBC
HCT: 45.2 % (ref 36.0–46.0)
Hemoglobin: 15.7 g/dL — ABNORMAL HIGH (ref 12.0–15.0)
MCH: 32.5 pg (ref 26.0–34.0)
MCHC: 34.7 g/dL (ref 30.0–36.0)
MCV: 93.6 fL (ref 80.0–100.0)
Platelets: 238 10*3/uL (ref 150–400)
RBC: 4.83 MIL/uL (ref 3.87–5.11)
RDW: 12.4 % (ref 11.5–15.5)
WBC: 7 10*3/uL (ref 4.0–10.5)
nRBC: 0 % (ref 0.0–0.2)

## 2020-08-04 LAB — TROPONIN I (HIGH SENSITIVITY)
Troponin I (High Sensitivity): 2 ng/L (ref <18)
Troponin I (High Sensitivity): <2 ng/L (ref <18)

## 2020-08-04 LAB — BASIC METABOLIC PANEL
Anion gap: 9 (ref 5–15)
BUN: 7 mg/dL (ref 6–20)
CO2: 24 mmol/L (ref 22–32)
Calcium: 9 mg/dL (ref 8.9–10.3)
Chloride: 104 mmol/L (ref 98–111)
Creatinine, Ser: 0.73 mg/dL (ref 0.44–1.00)
GFR, Estimated: >60 mL/min (ref >60)
Glucose, Bld: 99 mg/dL (ref 70–99)
Potassium: 3.8 mmol/L (ref 3.5–5.1)
Sodium: 137 mmol/L (ref 135–145)

## 2020-08-04 NOTE — ED Triage Notes (Signed)
Pt to er, pt states that Monday night when she was watching tv she had some dizziness and chest pain, states that the chest pain is off and on.  States that nothing particular causes the pain, states that it might be work or stress related, states that she has also had some dizziness and felt, "swimmy headed"

## 2020-08-04 NOTE — Discharge Instructions (Addendum)
The dizziness you are having may be from nasal/sinus congestion. To treat this use Flonase nasal spray 1 in each nostril twice a day for 2 or 3 weeks. Make sure you are drinking plenty of water, to keep yourself well-hydrated. Use Tylenol if needed for pain.

## 2020-08-04 NOTE — ED Provider Notes (Signed)
[facility] EMERGENCY DEPARTMENT Provider Note   CSN: [id number] Arrival date & time: 08/04/20  1802     History Chief Complaint  Patient presents with  . Chest Pain  . Dizziness    Evelyn Little is a 44 y.o. female.  HPI She presents for evaluation of a lightheaded feeling, present for 3 days with mild chest discomfort.  She wonders if it is related to stress because of busy time, at work.  She works from home, in a Nurse, learning disability.  She denies fever, chills, cough, shortness of breath, vertigo, gait disorder, paresthesia, weakness.  No known sick contacts.  She has not had a Covid vaccine.  She denies known sick contacts.  There are no other known modifying factors    Past Medical History:  Diagnosis Date  . Neurofibromatosis   . Vaginal delivery 1993, 2006    There are no problems to display for this patient.   Past Surgical History:  Procedure Laterality Date  . ABDOMINAL HYSTERECTOMY    . DIAGNOSTIC LAPAROSCOPY    . LAPAROSCOPIC ASSISTED VAGINAL HYSTERECTOMY Right 01/07/2014   Procedure: LAPAROSCOPIC ASSISTED VAGINAL HYSTERECTOMY WITH RIGHT SALPINGO OOPHORECTOMY ;  Surgeon: Meriel Pica, MD;  Location: [facility];  Service: Gynecology;  Laterality: Right;     OB History    Gravida  2   Para  2   Term      Preterm  2   AB      Living  2     SAB      IAB      Ectopic      Multiple      Live Births              Family History  Problem Relation Age of Onset  . Hypertension Mother     Social History   Tobacco Use  . Smoking status: Former Games developer  . Smokeless tobacco: Never Used  Vaping Use  . Vaping Use: Never used  Substance Use Topics  . Alcohol use: Yes  . Drug use: No    Home Medications Prior to Admission medications   Medication Sig Start Date End Date Taking? Authorizing Provider  cephALEXin (KEFLEX) 500 MG capsule Take 1 capsule (500 mg total) by mouth 4 (four) times daily. 07/25/16   Elson Areas, PA-C   ciprofloxacin (CIPRO) 500 MG tablet Take 500 mg by mouth 2 (two) times daily.    [provider]  HYDROcodone-acetaminophen (NORCO/VICODIN) 5-325 MG tablet Take 1-2 tablets by mouth every 6 (six) hours as needed. 02/18/20   Terrilee Files, MD  ondansetron (ZOFRAN) 4 MG tablet Take 1 tablet (4 mg total) by mouth every 8 (eight) hours as needed for nausea or vomiting. 02/18/20   Terrilee Files, MD    Allergies    Ampicillin, Penicillins, Bc powder [aspirin-salicylamide-caffeine], and Pseudoephedrine  Review of Systems   Review of Systems  All other systems reviewed and are negative.   Physical Exam Updated Vital Signs BP 140/88   Pulse 87   Temp 98.2 F (36.8 C) (Oral)   Resp 17   Ht 5\' 5"  (1.651 m)   Wt 67.1 kg   LMP 10/13/2013   SpO2 100%   BMI 24.63 kg/m   Physical Exam Vitals and nursing note reviewed.  Constitutional:      General: She is not in acute distress.    Appearance: She is well-developed and well-nourished. She is not ill-appearing, toxic-appearing or diaphoretic.  HENT:  Head: Normocephalic and atraumatic.     Right Ear: External ear normal.     Left Ear: External ear normal.     Nose: Congestion (Clear drainage) present.  Eyes:     Extraocular Movements: EOM normal.     Conjunctiva/sclera: Conjunctivae normal.     Pupils: Pupils are equal, round, and reactive to light.  Neck:     Trachea: Phonation normal.  Cardiovascular:     Rate and Rhythm: Normal rate and regular rhythm.  Pulmonary:     Effort: Pulmonary effort is normal.     Breath sounds: Normal breath sounds.  Chest:     Chest wall: No tenderness.  Abdominal:     General: There is no distension.     Palpations: Abdomen is soft.     Tenderness: There is no abdominal tenderness. There is no guarding.  Musculoskeletal:        General: Normal range of motion.     Cervical back: Normal range of motion and neck supple.  Skin:    General: Skin is warm and dry.  Neurological:      Mental Status: She is alert and oriented to person, place, and time.     Motor: No abnormal muscle tone.  Psychiatric:        Mood and Affect: Mood and affect and mood normal.        Behavior: Behavior normal.        Thought Content: Thought content normal.        Judgment: Judgment normal.     ED Results / Procedures / Treatments   Labs (all labs ordered are listed, but only abnormal results are displayed) Labs Reviewed  CBC - Abnormal; Notable for the following components:      Result Value   Hemoglobin 15.7 (*)    All other components within normal limits  BASIC METABOLIC PANEL  TROPONIN I (HIGH SENSITIVITY)  TROPONIN I (HIGH SENSITIVITY)    EKG EKG Interpretation  Date/Time:  Wednesday August 04 2020 18:16:49 EST Ventricular Rate:  94 PR Interval:  140 QRS Duration: 70 QT Interval:  340 QTC Calculation: 425 R Axis:   91 Text Interpretation: Normal sinus rhythm Rightward axis Borderline ECG since last tracing no significant change Confirmed by Mancel Bale [phone]) on 08/04/2020 7:16:10 PM   Radiology No results found.  Procedures Procedures (including critical care time)  Medications Ordered in ED Medications - No data to display  ED Course  I have reviewed the triage vital signs and the nursing notes.  Pertinent labs & imaging results that were available during my care of the patient were reviewed by me and considered in my medical decision making (see chart for details).    MDM Rules/Calculators/A&P                           Patient Vitals for the past 24 hrs:  BP Temp Temp src Pulse Resp SpO2 Height Weight  08/04/20 2008 137/82 98.2 F (36.8 C) Oral 82 20 100 % -- --  08/04/20 1930 140/88 -- -- 87 17 100 % -- --  08/04/20 1812 -- -- -- -- -- -- 5\' 5"  (1.651 m) 67.1 kg  08/04/20 1810 (!) 169/93 98.2 F (36.8 C) Oral 97 18 100 % -- --    At time of discharge reevaluation with update and discussion. After initial assessment and treatment, an  updated evaluation reveals she is comfortable and has no further complaints.08/06/20  Mancel Bale   Medical Decision Making:  This patient is presenting for evaluation of chest discomfort and dizziness for several days, which does require a range of treatment options, and is a complaint that involves a moderate risk of morbidity and mortality. The differential diagnoses include ACS, metabolic disorder, malaise, acute illness. I decided to review old records, and in summary middle-aged female percolating with nonspecific symptoms, evaluation in the ED to determine possible serious cause of symptoms.  I did not require additional historical information from anyone.  Clinical Laboratory Tests Ordered, included CBC, Metabolic panel and Delta troponin. Review indicates normal findings.   Critical Interventions-clinical evaluation, laboratory testing, observation reassessment  After These Interventions, the Patient was reevaluated and was found comfortable and stable for discharge.  Etiology of symptoms not clear, suspect general malaise, unspecified cause.  No indication for further ED evaluation or hospitalization.  No medications required at discharge.  CRITICAL CARE-no Performed by: Mancel Bale  Nursing Notes Reviewed/ Care Coordinated Applicable Imaging Reviewed Interpretation of Laboratory Data incorporated into ED treatment  The patient appears reasonably screened and/or stabilized for discharge and I doubt any other medical condition or other [facility] requiring further screening, evaluation, or treatment in the ED at this time prior to discharge.  Plan: Home Medications-continue usual; Home Treatments-rest, fluids; return here if the recommended treatment, does not improve the symptoms; Recommended follow up-PCP, as needed     Final Clinical Impression(s) / ED Diagnoses Final diagnoses:  Dizziness    Rx / DC Orders ED Discharge Orders    None       Mancel Bale, MD 08/14/20  1342

## 2020-08-04 NOTE — ED Notes (Signed)
Patient discharged home to self.  All discharge instructions reviewed.  Patient verbalized understanding via teachback method.  Ambulatory out of ED.   

## 2021-02-11 IMAGING — CT CT ABD-PELV W/ CM
2 of 5 series · 17 of 46 positions shown, 19 images · IV contrast (Omnipaque or Isovue)
Comparison: May 24, 2016

CLINICAL DATA: Right lower quadrant pain.

EXAM:
CT ABDOMEN AND PELVIS WITH CONTRAST
TECHNIQUE: Multidetector CT imaging of the abdomen and pelvis was performed
using the standard protocol following bolus administration of
intravenous contrast.
CONTRAST:  100mL OMNIPAQUE IOHEXOL 300 MG/ML  SOLN

[Series 2: axial st · axial · 0.81mm/px · z∈[+765,+1130]mm · 14 of 83 slices shown, 16 images]
[im 5/83  soft-tissue]
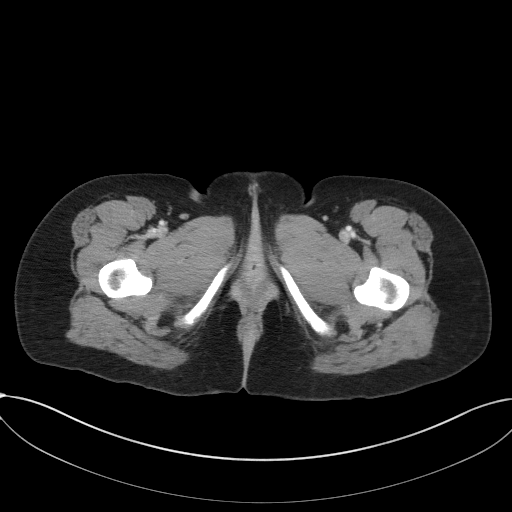
[im 5/83  bone]
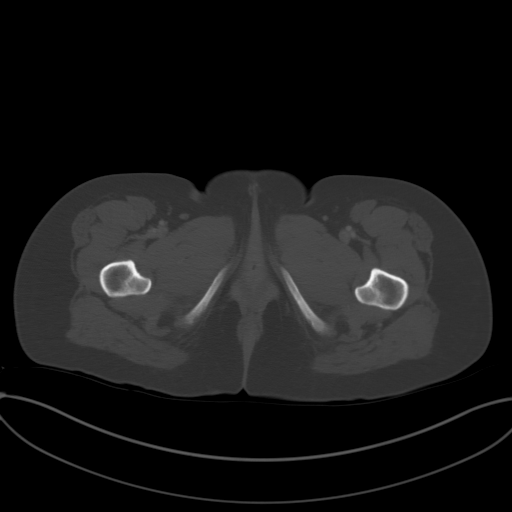
[im 9/83  soft-tissue]
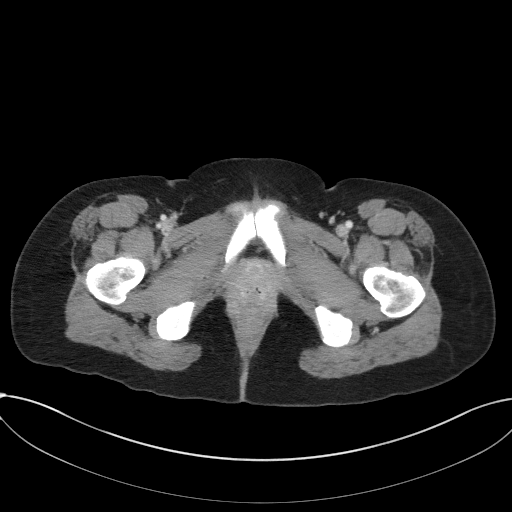
[im 18/83  soft-tissue]
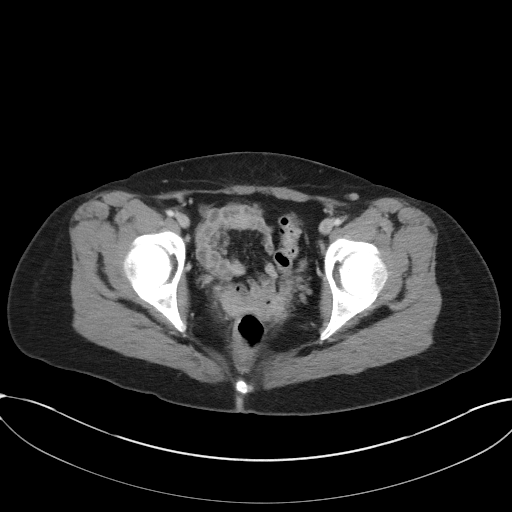
[im 22/83  soft-tissue]
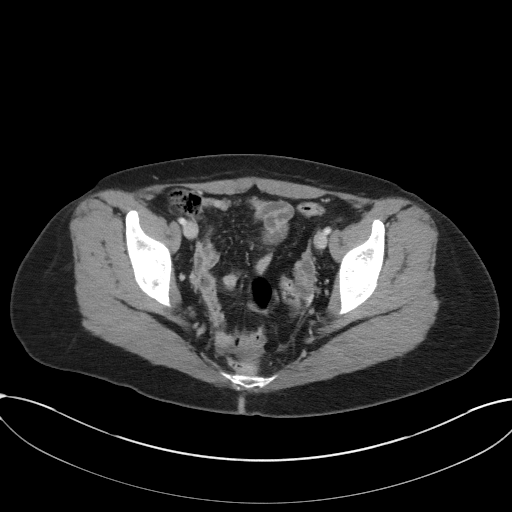
[im 26/83  soft-tissue]
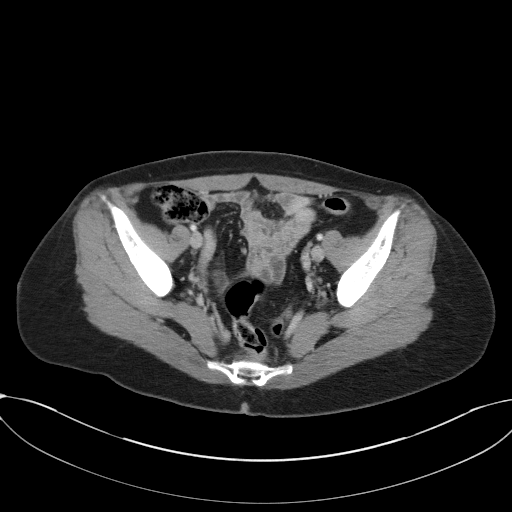
[im 35/83  soft-tissue]
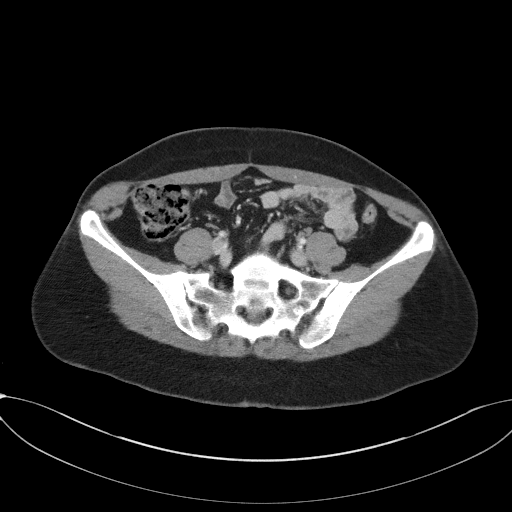
[im 39/83  soft-tissue]
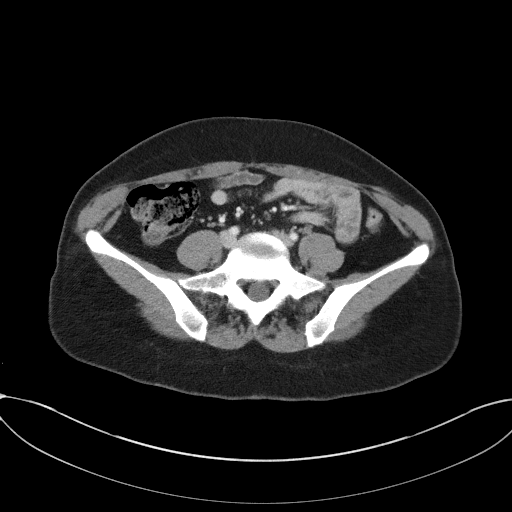
[im 44/83  soft-tissue]
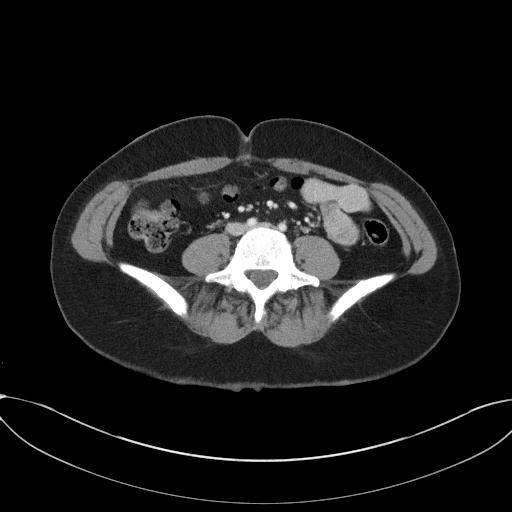
[im 48/83  soft-tissue]
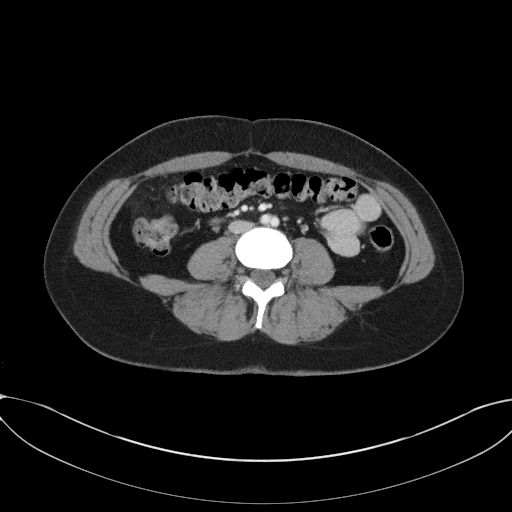
[im 48/83  bone]
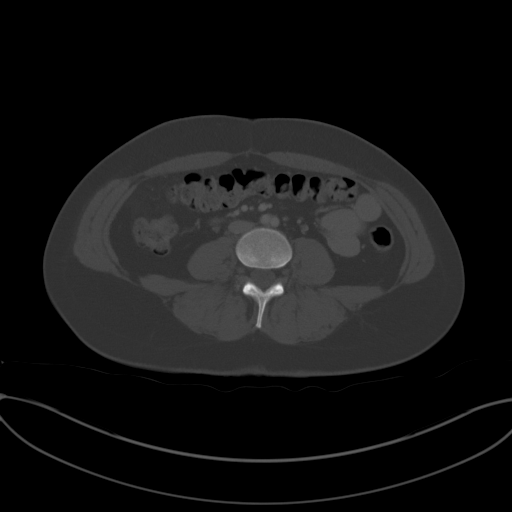
[im 57/83  soft-tissue]
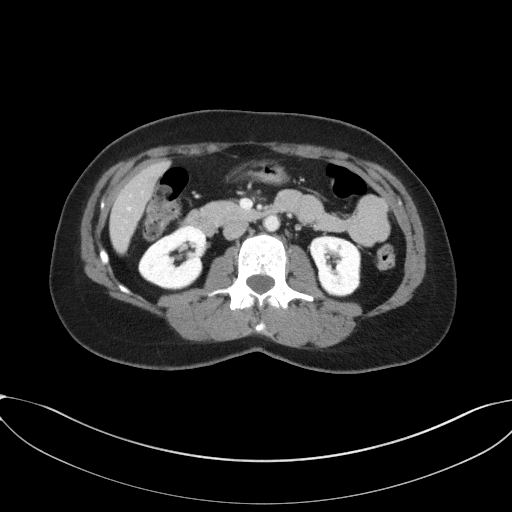
[im 61/83  soft-tissue]
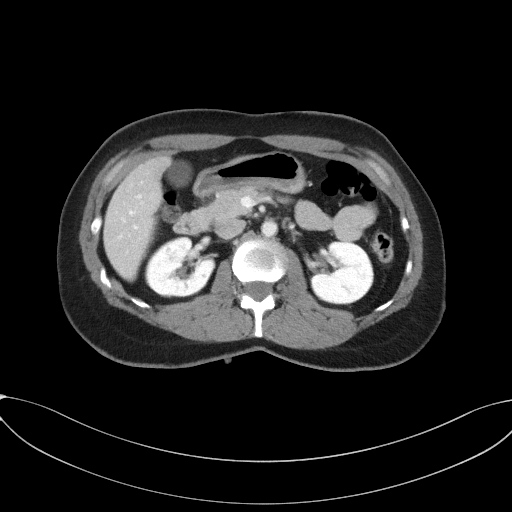
[im 65/83  soft-tissue]
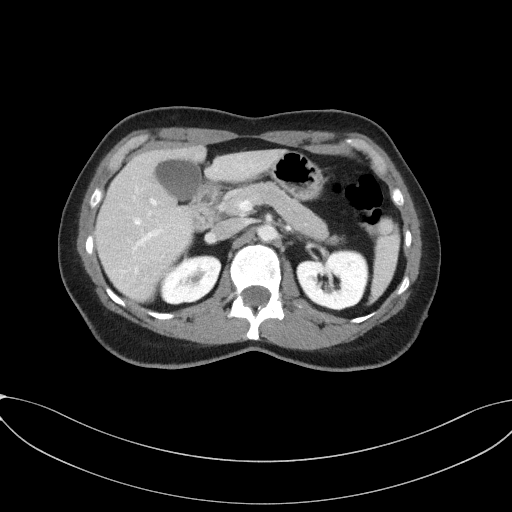
[im 74/83  soft-tissue]
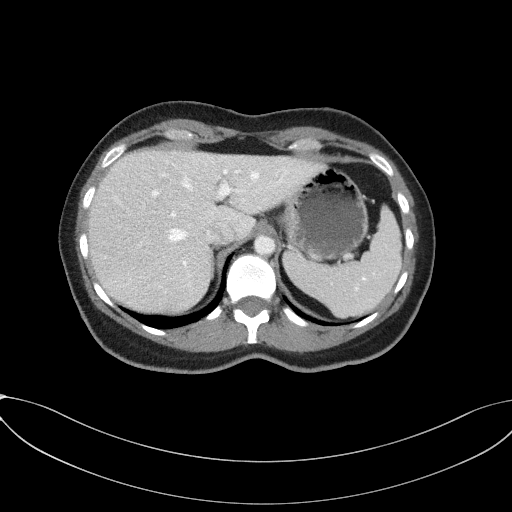
[im 78/83  soft-tissue]
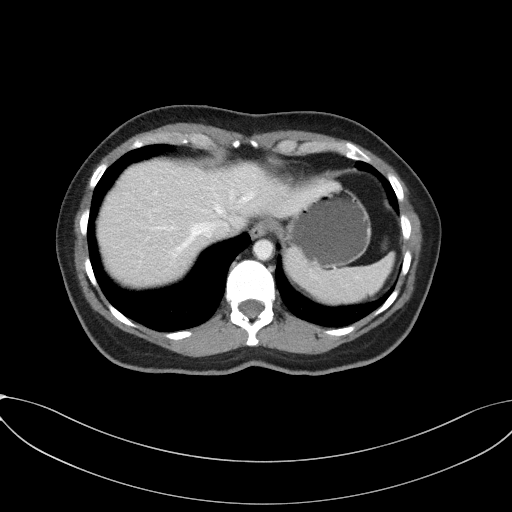

[Series 5: coronal st · coronal · 0.72mm/px · 3 of 91 slices shown]
[im 31/91  soft-tissue]
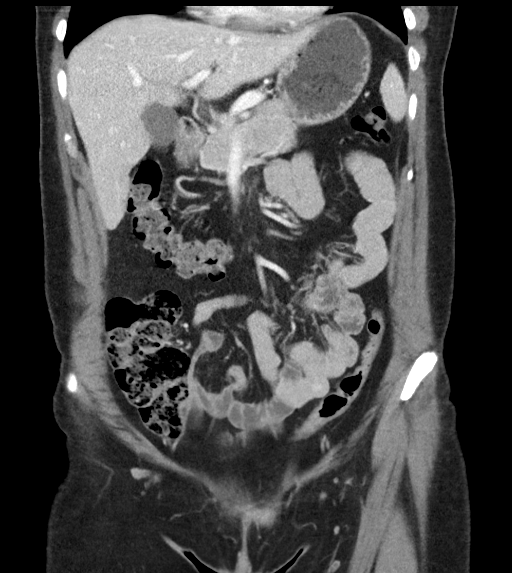
[im 41/91  soft-tissue]
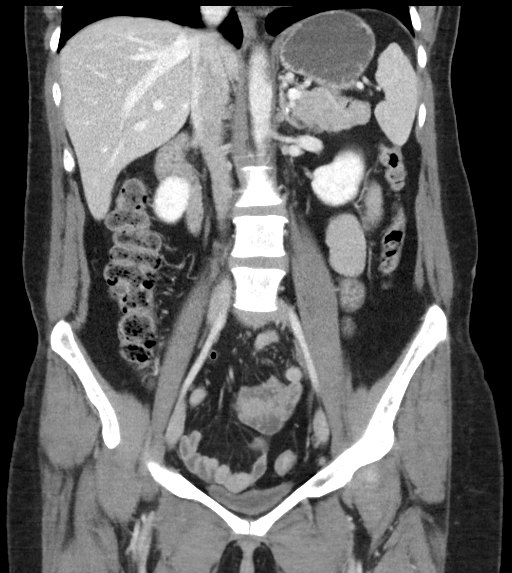
[im 51/91  soft-tissue]
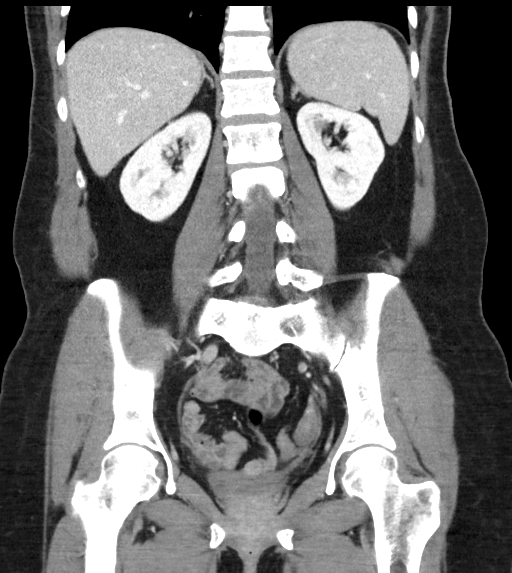

[17 of 46 positions shown; findings below may reference images not displayed]

FINDINGS: Lower chest: No acute abnormality.

Hepatobiliary: No focal liver abnormality is seen. No gallstones,
gallbladder wall thickening, or biliary dilatation.

Pancreas: Unremarkable. No pancreatic ductal dilatation or
surrounding inflammatory changes.

Spleen: Normal in size without focal abnormality.

Adrenals/Urinary Tract: Adrenal glands are unremarkable. Kidneys are
normal in size, without renal calculi or hydronephrosis.
Subcentimeter simple cysts are seen within the right kidney. Bladder
is unremarkable.

Stomach/Bowel: Stomach is within normal limits. Appendix appears
normal. No evidence of bowel wall thickening, distention, or
inflammatory changes.

Vascular/Lymphatic: No significant vascular findings are present. No
enlarged abdominal or pelvic lymph nodes.

Reproductive: Status post hysterectomy. No adnexal masses.

Other: No abdominal wall hernia or abnormality. No abdominopelvic
ascites.

Musculoskeletal: Mild endplate sclerosis is seen along the anterior
aspect of the superior endplate of the L4 vertebral body.
IMPRESSION: 1. Small simple right renal cyst.

## 2022-01-19 ENCOUNTER — Encounter (INDEPENDENT_AMBULATORY_CARE_PROVIDER_SITE_OTHER): Payer: Self-pay | Admitting: *Deleted

## 2022-07-17 ENCOUNTER — Encounter (INDEPENDENT_AMBULATORY_CARE_PROVIDER_SITE_OTHER): Payer: Self-pay | Admitting: *Deleted

## 2024-01-25 ENCOUNTER — Other Ambulatory Visit (HOSPITAL_COMMUNITY): Payer: Self-pay | Admitting: Family Medicine

## 2024-01-25 DIAGNOSIS — Q8500 Neurofibromatosis, unspecified: Secondary | ICD-10-CM
